# Patient Record
Sex: Male | Born: 1985 | Race: White | Hispanic: No | Smoking: Never smoker
Health system: Southern US, Community
[De-identification: ages and names within clinical notes are randomized; demographics above are authoritative.]

## PROBLEM LIST (undated history)

## (undated) DIAGNOSIS — K219 Gastro-esophageal reflux disease without esophagitis: Secondary | ICD-10-CM

## (undated) HISTORY — PX: HAND SURGERY: SHX662

## (undated) HISTORY — PX: VASECTOMY: SHX75

---

## 2005-02-28 ENCOUNTER — Ambulatory Visit: Payer: Self-pay | Admitting: Family Medicine

## 2005-11-07 ENCOUNTER — Ambulatory Visit: Payer: Self-pay | Admitting: Family Medicine

## 2006-01-11 ENCOUNTER — Ambulatory Visit: Payer: Self-pay | Admitting: Family Medicine

## 2006-05-10 ENCOUNTER — Ambulatory Visit: Payer: Self-pay | Admitting: Family Medicine

## 2017-08-28 ENCOUNTER — Ambulatory Visit (INDEPENDENT_AMBULATORY_CARE_PROVIDER_SITE_OTHER): Payer: BLUE CROSS/BLUE SHIELD | Admitting: Urology

## 2017-08-28 DIAGNOSIS — Z302 Encounter for sterilization: Secondary | ICD-10-CM

## 2017-10-09 ENCOUNTER — Encounter (INDEPENDENT_AMBULATORY_CARE_PROVIDER_SITE_OTHER): Payer: BLUE CROSS/BLUE SHIELD | Admitting: Urology

## 2017-10-09 DIAGNOSIS — Z302 Encounter for sterilization: Secondary | ICD-10-CM | POA: Diagnosis not present

## 2018-01-25 ENCOUNTER — Emergency Department (HOSPITAL_COMMUNITY): Payer: BLUE CROSS/BLUE SHIELD

## 2018-01-25 ENCOUNTER — Other Ambulatory Visit: Payer: Self-pay | Admitting: Orthopedic Surgery

## 2018-01-25 ENCOUNTER — Encounter (HOSPITAL_COMMUNITY): Payer: Self-pay

## 2018-01-25 ENCOUNTER — Encounter (HOSPITAL_COMMUNITY): Payer: Self-pay | Admitting: Emergency Medicine

## 2018-01-25 ENCOUNTER — Other Ambulatory Visit: Payer: Self-pay

## 2018-01-25 ENCOUNTER — Encounter (HOSPITAL_COMMUNITY)
Admission: RE | Admit: 2018-01-25 | Discharge: 2018-01-25 | Disposition: A | Discharge status: Home / Self Care | Payer: BLUE CROSS/BLUE SHIELD | Source: Ambulatory Visit | Attending: Orthopedic Surgery | Admitting: Orthopedic Surgery

## 2018-01-25 ENCOUNTER — Emergency Department (HOSPITAL_COMMUNITY)
Admission: EM | Admit: 2018-01-25 | Discharge: 2018-01-25 | Disposition: A | Discharge status: Home / Self Care | Payer: BLUE CROSS/BLUE SHIELD | Attending: Emergency Medicine | Admitting: Emergency Medicine

## 2018-01-25 DIAGNOSIS — Y999 Unspecified external cause status: Secondary | ICD-10-CM | POA: Diagnosis not present

## 2018-01-25 DIAGNOSIS — S4992XA Unspecified injury of left shoulder and upper arm, initial encounter: Secondary | ICD-10-CM | POA: Diagnosis present

## 2018-01-25 DIAGNOSIS — S52591A Other fractures of lower end of right radius, initial encounter for closed fracture: Secondary | ICD-10-CM | POA: Insufficient documentation

## 2018-01-25 DIAGNOSIS — Q899 Congenital malformation, unspecified: Secondary | ICD-10-CM

## 2018-01-25 DIAGNOSIS — Y929 Unspecified place or not applicable: Secondary | ICD-10-CM | POA: Diagnosis not present

## 2018-01-25 DIAGNOSIS — Y939 Activity, unspecified: Secondary | ICD-10-CM | POA: Insufficient documentation

## 2018-01-25 DIAGNOSIS — W228XXA Striking against or struck by other objects, initial encounter: Secondary | ICD-10-CM | POA: Insufficient documentation

## 2018-01-25 HISTORY — DX: Gastro-esophageal reflux disease without esophagitis: K21.9

## 2018-01-25 LAB — CBC WITH DIFFERENTIAL/PLATELET
Basophils Absolute: 0 10*3/uL (ref 0.0–0.1)
Basophils Relative: 0 %
EOS ABS: 0.1 10*3/uL (ref 0.0–0.7)
EOS PCT: 1 %
HCT: 47.1 % (ref 39.0–52.0)
Hemoglobin: 16.6 g/dL (ref 13.0–17.0)
LYMPHS ABS: 1.7 10*3/uL (ref 0.7–4.0)
LYMPHS PCT: 26 %
MCH: 32 pg (ref 26.0–34.0)
MCHC: 35.2 g/dL (ref 30.0–36.0)
MCV: 90.9 fL (ref 78.0–100.0)
Monocytes Absolute: 0.4 10*3/uL (ref 0.1–1.0)
Monocytes Relative: 6 %
NEUTROS PCT: 67 %
Neutro Abs: 4.5 10*3/uL (ref 1.7–7.7)
PLATELETS: 178 10*3/uL (ref 150–400)
RBC: 5.18 MIL/uL (ref 4.22–5.81)
RDW: 12.6 % (ref 11.5–15.5)
WBC: 6.7 10*3/uL (ref 4.0–10.5)

## 2018-01-25 MED ORDER — LIDOCAINE-EPINEPHRINE (PF) 1 %-1:200000 IJ SOLN
INTRAMUSCULAR | Status: AC
  Administered 2018-01-25: 12:00:00
  Filled 2018-01-25: qty 30

## 2018-01-25 MED ORDER — MORPHINE SULFATE (PF) 4 MG/ML IV SOLN
4.0000 mg | Freq: Once | INTRAVENOUS | Status: AC
  Administered 2018-01-25: 4 mg via INTRAVENOUS
  Filled 2018-01-25: qty 1

## 2018-01-25 MED ORDER — OXYCODONE-ACETAMINOPHEN 5-325 MG PO TABS: 1.0000 | ORAL_TABLET | ORAL | 0 refills | Status: DC | PRN

## 2018-01-25 MED ORDER — HYDROMORPHONE HCL 1 MG/ML IJ SOLN
INTRAMUSCULAR | Status: AC
  Administered 2018-01-25: 1 mg
  Filled 2018-01-25: qty 1

## 2018-01-25 NOTE — Progress Notes (Signed)
Patient ID: James Reed, male   DOB: 1985-09-27, 32 y.o.   MRN: [id number]  History reviewed. No pertinent past medical history.  Past Surgical History:  Procedure Laterality Date  . HAND SURGERY Left    4th and 5th metacarpal   No current facility-administered medications on file prior to encounter.    No current outpatient medications on file prior to encounter.    Scheduled Meds: Continuous Infusions: PRN Meds:.

## 2018-01-25 NOTE — ED Notes (Signed)
Pt transported to XRay 

## 2018-01-25 NOTE — Discharge Instructions (Signed)
Dr. Romeo AppleHarrison is arranging for you to have surgery tomorrow.  The office will contact you.  Please discuss this with the office and let them know what times are best for you, this may be a same-day procedure.
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
## 2018-01-28 ENCOUNTER — Telehealth: Payer: Self-pay | Admitting: Radiology

## 2018-01-28 ENCOUNTER — Encounter: Payer: Self-pay | Admitting: Orthopedic Surgery

## 2018-01-28 ENCOUNTER — Ambulatory Visit: Payer: BLUE CROSS/BLUE SHIELD | Admitting: Orthopedic Surgery

## 2018-01-28 DIAGNOSIS — S52371A Galeazzi's fracture of right radius, initial encounter for closed fracture: Secondary | ICD-10-CM

## 2018-01-28 NOTE — Patient Instructions (Addendum)
OUT OF WORK 12 WEEKS    Radial Fracture A radial fracture is a break in the radius bone, which is the long bone of the forearm that is on the same side as your thumb. Your forearm is the part of your arm that is between your elbow and your wrist. It is made up of two bones: the radius and the ulna. Most radial fractures occur near the wrist (distal radialfracture) or near the elbow (radial head fracture). A distal radial fracture is the most common type of broken arm. This fracture usually occurs about an inch above the wrist. Fractures of the middle part of the bone are less common. What are the causes? Falling with your arm outstretched is the most common cause of a radial fracture. Other causes include:  Car accidents.  Bike accidents.  A direct blow to the middle part of the radius.  What increases the risk?  You may be at greater risk for a distal radial fracture if you are 34 years of age or older.  You may be at greater risk for a radial head fracture if you are: ? Male. ? 4330-34 years old.  You may be at a greater risk for all types of radial fractures if you have a condition that causes your bones to be weak or thin (osteoporosis). What are the signs or symptoms? A radial fracture causes pain immediately after the injury. Other signs and symptoms include:  An abnormal bend or bump in your arm (deformity).  Swelling.  Bruising.  Numbness or tingling.  Tenderness.  Limited movement.  How is this diagnosed? Your health care provider may diagnose a radial fracture based on:  Your symptoms.  Your medical history, including any recent injury.  A physical exam. Your health care provider will look for any deformity and feel for tenderness over the break. Your health care provider will also check whether the bone is out of place.  An X-ray exam to confirm the diagnosis and learn more about the type of fracture.  How is this treated? The goals of treatment are to  get the bone in proper position for healing and to keep it from moving so it will heal over time. Your treatment will depend on many factors, especially the type of fracture that you have.  If the fractured bone: ? Is in the correct position (nondisplaced), you may only need to wear a cast or a splint. ? Has a slightly displaced fracture, you may need to have the bones moved back into place manually (closed reduction) before the splint or cast is put on.  You may have a temporary splint before you have a plaster cast. The splint allows room for some swelling. After a few days, a cast can replace the splint. ? You may have to wear the cast for about 6 weeks or as directed by your health care provider. ? The cast may be changed after about 3 weeks or as directed by your health care provider.  After your cast is taken off, you may need physical therapy to regain full movement in your wrist or elbow.  You may need emergency surgery if you have: ? A fractured bone that is out of position (displaced). ? A fracture with multiple fragments (comminuted fracture). ? A fracture that breaks the skin (open fracture). This type of fracture may require surgical wires, plates, or screws to hold the bone in place.  You may have X-rays every couple of weeks to check on  your healing.  Follow these instructions at home:  Keep the injured arm above the level of your heart while you are sitting or lying down. This helps to reduce swelling and pain.  Apply ice to the injured area: ? Put ice in a plastic bag. ? Place a towel between your skin and the bag. ? Leave the ice on for 20 minutes, 2-3 times per day.  Move your fingers often to avoid stiffness and to minimize swelling.  If you have a plaster or fiberglass cast: ? Do not try to scratch the skin under the cast using sharp or pointed objects. ? Check the skin around the cast every day. You may put lotion on any red or sore areas. ? Keep your cast dry  and clean.  If you have a plaster splint: ? Wear the splint as directed. ? Loosen the elastic around the splint if your fingers become numb and tingle, or if they turn cold and blue.  Do not put pressure on any part of your cast until it is fully hardened. Rest your cast only on a pillow for the first 24 hours.  Protect your cast or splint while bathing or showering, as directed by your health care provider. Do not put your cast or splint into water.  Take medicines only as directed by your health care provider.  Return to activities, such as sports, as directed by your health care provider. Ask your health care provider what activities are safe for you.  Keep all follow-up visits as directed by your health care provider. This is important. Contact a health care provider if:  Your pain medicine is not helping.  Your cast gets damaged or it breaks.  Your cast becomes loose.  Your cast gets wet.  You have more severe pain or swelling than you did before the cast.  You have severe pain when stretching your fingers.  You continue to have pain or stiffness in your elbow or your wrist after your cast is taken off. Get help right away if:  You cannot move your fingers.  You lose feeling in your fingers or your hand.  Your hand or your fingers turn cold and pale or blue.  You notice a bad smell coming from your cast.  You have drainage from underneath your cast.  You have new stains from blood or drainage seeping through your cast. This information is not intended to replace advice given to you by your health care provider. Make sure you discuss any questions you have with your health care provider. Document Released: 12/14/2005 Document Revised: 12/07/2015 Document Reviewed: 12/26/2013 Elsevier Interactive Patient Education  [employer].
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
## 2018-01-28 NOTE — Telephone Encounter (Signed)
I called [employer] today spoke to Ashtine J. 1:26 pm no prior authorization needed for the CPT code [id number] scheduled for tomorrow. Reference number for the call is her name todays date and time.

## 2018-01-29 ENCOUNTER — Ambulatory Visit (HOSPITAL_COMMUNITY): Payer: BLUE CROSS/BLUE SHIELD

## 2018-01-29 ENCOUNTER — Other Ambulatory Visit: Payer: Self-pay

## 2018-01-29 ENCOUNTER — Ambulatory Visit (HOSPITAL_COMMUNITY): Payer: BLUE CROSS/BLUE SHIELD | Admitting: Anesthesiology

## 2018-01-29 ENCOUNTER — Ambulatory Visit (HOSPITAL_COMMUNITY)
Admission: RE | Admit: 2018-01-29 | Discharge: 2018-01-29 | Disposition: A | Discharge status: Home / Self Care | Payer: BLUE CROSS/BLUE SHIELD | Source: Ambulatory Visit | Attending: Orthopedic Surgery | Admitting: Orthopedic Surgery

## 2018-01-29 ENCOUNTER — Encounter (HOSPITAL_COMMUNITY): Payer: Self-pay | Admitting: *Deleted

## 2018-01-29 ENCOUNTER — Encounter (HOSPITAL_COMMUNITY)
Admission: RE | Disposition: A | Discharge status: Home / Self Care | Payer: Self-pay | Source: Ambulatory Visit | Attending: Orthopedic Surgery

## 2018-01-29 DIAGNOSIS — K219 Gastro-esophageal reflux disease without esophagitis: Secondary | ICD-10-CM | POA: Diagnosis not present

## 2018-01-29 DIAGNOSIS — Y92481 Parking lot as the place of occurrence of the external cause: Secondary | ICD-10-CM | POA: Diagnosis not present

## 2018-01-29 DIAGNOSIS — Y9389 Activity, other specified: Secondary | ICD-10-CM | POA: Insufficient documentation

## 2018-01-29 DIAGNOSIS — Y998 Other external cause status: Secondary | ICD-10-CM | POA: Insufficient documentation

## 2018-01-29 DIAGNOSIS — S52611A Displaced fracture of right ulna styloid process, initial encounter for closed fracture: Secondary | ICD-10-CM | POA: Insufficient documentation

## 2018-01-29 DIAGNOSIS — S52371D Galeazzi's fracture of right radius, subsequent encounter for closed fracture with routine healing: Secondary | ICD-10-CM | POA: Diagnosis not present

## 2018-01-29 DIAGNOSIS — S62101A Fracture of unspecified carpal bone, right wrist, initial encounter for closed fracture: Secondary | ICD-10-CM

## 2018-01-29 DIAGNOSIS — S52371A Galeazzi's fracture of right radius, initial encounter for closed fracture: Secondary | ICD-10-CM

## 2018-01-29 DIAGNOSIS — W19XXXA Unspecified fall, initial encounter: Secondary | ICD-10-CM | POA: Diagnosis not present

## 2018-01-29 DIAGNOSIS — S52501A Unspecified fracture of the lower end of right radius, initial encounter for closed fracture: Secondary | ICD-10-CM | POA: Diagnosis present

## 2018-01-29 HISTORY — PX: ORIF WRIST FRACTURE: SHX2133

## 2018-01-29 LAB — MRSA PCR SCREENING: MRSA by PCR: NEGATIVE

## 2018-01-29 SURGERY — OPEN REDUCTION INTERNAL FIXATION (ORIF) WRIST FRACTURE
Anesthesia: General | Laterality: Right

## 2018-01-29 MED ORDER — GABAPENTIN 300 MG PO CAPS
300.0000 mg | ORAL_CAPSULE | Freq: Once | ORAL | Status: AC
  Administered 2018-01-29: 300 mg via ORAL

## 2018-01-29 MED ORDER — FENTANYL CITRATE (PF) 100 MCG/2ML IJ SOLN
INTRAMUSCULAR | Status: DC | PRN
  Administered 2018-01-29 (×3): 25 ug via INTRAVENOUS
  Administered 2018-01-29: 50 ug via INTRAVENOUS
  Administered 2018-01-29 (×2): 25 ug via INTRAVENOUS
  Administered 2018-01-29 (×2): 50 ug via INTRAVENOUS
  Administered 2018-01-29: 25 ug via INTRAVENOUS
  Administered 2018-01-29: 50 ug via INTRAVENOUS

## 2018-01-29 MED ORDER — CEFAZOLIN SODIUM-DEXTROSE 2-4 GM/100ML-% IV SOLN
2.0000 g | INTRAVENOUS | Status: AC
  Administered 2018-01-29: 2 g via INTRAVENOUS

## 2018-01-29 MED ORDER — FENTANYL CITRATE (PF) 250 MCG/5ML IJ SOLN
INTRAMUSCULAR | Status: AC
  Filled 2018-01-29: qty 5

## 2018-01-29 MED ORDER — KETOROLAC TROMETHAMINE 30 MG/ML IJ SOLN
30.0000 mg | Freq: Once | INTRAMUSCULAR | Status: AC
  Administered 2018-01-29: 30 mg via INTRAVENOUS

## 2018-01-29 MED ORDER — FENTANYL CITRATE (PF) 100 MCG/2ML IJ SOLN
INTRAMUSCULAR | Status: AC
  Filled 2018-01-29: qty 2

## 2018-01-29 MED ORDER — GABAPENTIN 300 MG PO CAPS
ORAL_CAPSULE | ORAL | Status: AC
  Filled 2018-01-29: qty 1

## 2018-01-29 MED ORDER — SODIUM CHLORIDE 0.9 % IR SOLN
Status: DC | PRN
  Administered 2018-01-29: 1000 mL

## 2018-01-29 MED ORDER — OXYCODONE HCL 5 MG PO TABS
ORAL_TABLET | ORAL | Status: AC
  Filled 2018-01-29: qty 1

## 2018-01-29 MED ORDER — KETOROLAC TROMETHAMINE 30 MG/ML IJ SOLN
INTRAMUSCULAR | Status: AC
  Filled 2018-01-29: qty 1

## 2018-01-29 MED ORDER — BUPIVACAINE HCL (PF) 0.5 % IJ SOLN
INTRAMUSCULAR | Status: DC | PRN
  Administered 2018-01-29: 30 mL

## 2018-01-29 MED ORDER — OXYCODONE-ACETAMINOPHEN 10-325 MG PO TABS: 1.0000 | ORAL_TABLET | ORAL | 0 refills | Status: DC | PRN

## 2018-01-29 MED ORDER — FENTANYL CITRATE (PF) 100 MCG/2ML IJ SOLN
25.0000 ug | INTRAMUSCULAR | Status: DC | PRN
  Administered 2018-01-29 (×4): 50 ug via INTRAVENOUS
  Filled 2018-01-29 (×2): qty 2

## 2018-01-29 MED ORDER — CEFAZOLIN SODIUM-DEXTROSE 2-4 GM/100ML-% IV SOLN
INTRAVENOUS | Status: AC
  Filled 2018-01-29: qty 100

## 2018-01-29 MED ORDER — POVIDONE-IODINE 10 % EX SWAB: 2.0000 "application " | Freq: Once | CUTANEOUS | Status: DC

## 2018-01-29 MED ORDER — OXYCODONE HCL 5 MG PO TABS
5.0000 mg | ORAL_TABLET | Freq: Once | ORAL | Status: AC
  Administered 2018-01-29: 5 mg via ORAL

## 2018-01-29 MED ORDER — KETAMINE HCL 10 MG/ML IJ SOLN
INTRAMUSCULAR | Status: DC | PRN
  Administered 2018-01-29 (×2): 10 mg via INTRAVENOUS

## 2018-01-29 MED ORDER — IBUPROFEN 800 MG PO TABS: 800.0000 mg | ORAL_TABLET | Freq: Three times a day (TID) | ORAL | 1 refills | Status: DC | PRN

## 2018-01-29 MED ORDER — MIDAZOLAM HCL 5 MG/5ML IJ SOLN
INTRAMUSCULAR | Status: DC | PRN
  Administered 2018-01-29: 2 mg via INTRAVENOUS

## 2018-01-29 MED ORDER — KETAMINE HCL 10 MG/ML IJ SOLN
INTRAMUSCULAR | Status: AC
  Filled 2018-01-29: qty 1

## 2018-01-29 MED ORDER — IBUPROFEN 800 MG PO TABS
ORAL_TABLET | ORAL | Status: AC
  Filled 2018-01-29: qty 1

## 2018-01-29 MED ORDER — LACTATED RINGERS IV SOLN
INTRAVENOUS | Status: DC
  Administered 2018-01-29: 13:00:00 via INTRAVENOUS

## 2018-01-29 MED ORDER — LIDOCAINE HCL (CARDIAC) PF 50 MG/5ML IV SOSY
PREFILLED_SYRINGE | INTRAVENOUS | Status: DC | PRN
  Administered 2018-01-29: 30 mg via INTRAVENOUS

## 2018-01-29 MED ORDER — HYDROCODONE-ACETAMINOPHEN 7.5-325 MG PO TABS
1.0000 | ORAL_TABLET | Freq: Once | ORAL | Status: DC | PRN
Start: 2018-01-29 — End: 2018-01-31

## 2018-01-29 MED ORDER — PROPOFOL 10 MG/ML IV BOLUS
INTRAVENOUS | Status: DC | PRN
  Administered 2018-01-29: 150 mg via INTRAVENOUS

## 2018-01-29 MED ORDER — ACETAMINOPHEN 500 MG PO TABS
500.0000 mg | ORAL_TABLET | Freq: Once | ORAL | Status: AC
  Administered 2018-01-29: 500 mg via ORAL

## 2018-01-29 MED ORDER — ACETAMINOPHEN 500 MG PO TABS
ORAL_TABLET | ORAL | Status: AC
  Filled 2018-01-29: qty 1

## 2018-01-29 MED ORDER — BUPIVACAINE HCL (PF) 0.5 % IJ SOLN
INTRAMUSCULAR | Status: AC
  Filled 2018-01-29: qty 30

## 2018-01-29 MED ORDER — MIDAZOLAM HCL 2 MG/2ML IJ SOLN
INTRAMUSCULAR | Status: AC
  Filled 2018-01-29: qty 2

## 2018-01-29 MED ORDER — IBUPROFEN 800 MG PO TABS
800.0000 mg | ORAL_TABLET | Freq: Once | ORAL | Status: AC
  Administered 2018-01-29: 800 mg via ORAL

## 2018-01-29 MED ORDER — PROMETHAZINE HCL 12.5 MG PO TABS: 12.5000 mg | ORAL_TABLET | Freq: Four times a day (QID) | ORAL | 0 refills | Status: DC | PRN

## 2018-01-29 MED ORDER — CHLORHEXIDINE GLUCONATE 4 % EX LIQD: 60.0000 mL | Freq: Once | CUTANEOUS | Status: DC

## 2018-01-29 SURGICAL SUPPLY — 41 items
BANDAGE ELASTIC 3 LF NS (GAUZE/BANDAGES/DRESSINGS) ×3 IMPLANT
BANDAGE ELASTIC 4 LF NS (GAUZE/BANDAGES/DRESSINGS) ×3 IMPLANT
BANDAGE ESMARK 4X12 BL STRL LF (DISPOSABLE) ×1 IMPLANT
BIT DRILL 2.5X110 QC LCP DISP (BIT) ×3 IMPLANT
BNDG COHESIVE 4X5 TAN STRL (GAUZE/BANDAGES/DRESSINGS) ×3 IMPLANT
BNDG ESMARK 4X12 BLUE STRL LF (DISPOSABLE) ×3
CHLORAPREP W/TINT 26ML (MISCELLANEOUS) ×3 IMPLANT
CLOTH BEACON ORANGE TIMEOUT ST (SAFETY) ×3 IMPLANT
COVER LIGHT HANDLE STERIS (MISCELLANEOUS) ×6 IMPLANT
CUFF TOURNIQUET SINGLE 18IN (TOURNIQUET CUFF) ×3 IMPLANT
DRAPE C-ARM FOLDED MOBILE STRL (DRAPES) ×3 IMPLANT
ELECT REM PT RETURN 9FT ADLT (ELECTROSURGICAL) ×3
ELECTRODE REM PT RTRN 9FT ADLT (ELECTROSURGICAL) ×1 IMPLANT
GAUZE SPONGE 4X4 12PLY STRL (GAUZE/BANDAGES/DRESSINGS) ×3 IMPLANT
GAUZE XEROFORM 5X9 LF (GAUZE/BANDAGES/DRESSINGS) ×3 IMPLANT
GLOVE BIO SURGEON STRL SZ7 (GLOVE) ×3 IMPLANT
GLOVE SKINSENSE NS SZ8.0 LF (GLOVE) ×2
GLOVE SKINSENSE STRL SZ8.0 LF (GLOVE) ×1 IMPLANT
GLOVE SS N UNI LF 8.5 STRL (GLOVE) ×3 IMPLANT
GOWN STRL REUS W/ TWL LRG LVL3 (GOWN DISPOSABLE) ×1 IMPLANT
GOWN STRL REUS W/TWL LRG LVL3 (GOWN DISPOSABLE) ×5 IMPLANT
GOWN STRL REUS W/TWL XL LVL3 (GOWN DISPOSABLE) ×3 IMPLANT
KIT TURNOVER KIT A (KITS) ×3 IMPLANT
MANIFOLD NEPTUNE II (INSTRUMENTS) ×3 IMPLANT
NEEDLE HYPO 21X1.5 SAFETY (NEEDLE) ×3 IMPLANT
NS IRRIG 1000ML POUR BTL (IV SOLUTION) ×6 IMPLANT
PACK BASIC LIMB (CUSTOM PROCEDURE TRAY) ×3 IMPLANT
PAD ARMBOARD 7.5X6 YLW CONV (MISCELLANEOUS) ×3 IMPLANT
PROS LCP PLATE 6H 85MM (Plate) ×3 IMPLANT
PROSTHESIS LCP PLATE 6H 85MM (Plate) ×1 IMPLANT
SCREW CORTEX 3.5 16MM (Screw) ×8 IMPLANT
SCREW CORTEX 3.5 18MM (Screw) ×4 IMPLANT
SCREW LOCK CORT ST 3.5X16 (Screw) ×4 IMPLANT
SCREW LOCK CORT ST 3.5X18 (Screw) ×2 IMPLANT
SET BASIN LINEN APH (SET/KITS/TRAYS/PACK) ×3 IMPLANT
SPLINT IMMOBILIZER J 3INX20FT (CAST SUPPLIES) ×2
SPLINT J IMMOBILIZER 3X20FT (CAST SUPPLIES) ×1 IMPLANT
STAPLER VISISTAT 35W (STAPLE) ×3 IMPLANT
SUT MON AB 2-0 SH 27 (SUTURE) ×2
SUT MON AB 2-0 SH27 (SUTURE) ×1 IMPLANT
SYR 10ML LL (SYRINGE) ×3 IMPLANT

## 2018-01-29 NOTE — Brief Op Note (Signed)
01/29/2018  2:35 PM  PATIENT:  James NaasKenneth D Reed  32 y.o. male  PRE-OPERATIVE DIAGNOSIS:  fracture right wrist/distal radius/forearm, GALEAZZI fracture  POST-OPERATIVE DIAGNOSIS:  fracture right wrist/distal radius/forearm, GALEAZZI fracture  PROCEDURE:  Procedure(s): OPEN REDUCTION INTERNAL FIXATION (ORIF) RIGHT WRIST/distal radius/forearm, GALEAZZI  FRACTURE (Right)-25525  SURGEON:  Surgeon(s) and Role:    * Vickki HearingHarrison, Afton Lavalle E, MD - Primary  PHYSICIAN ASSISTANT:   ASSISTANTS: BETTY ASHLEY    ANESTHESIA:   general  EBL:  10 mL   BLOOD ADMINISTERED:none  DRAINS: none   LOCAL MEDICATIONS USED:  MARCAINE     SPECIMEN:  No Specimen  DISPOSITION OF SPECIMEN:  N/A  COUNTS:  YES  TOURNIQUET:   Total Tourniquet Time Documented: Upper Arm (Right) - 44 minutes Total: Upper Arm (Right) - 44 minutes   DICTATION: .Reubin Milanragon Dictation  PLAN OF CARE: Discharge to home after PACU  PATIENT DISPOSITION:  PACU - hemodynamically stable.   Delay start of Pharmacological VTE agent (>24hrs) due to surgical blood loss or risk of bleeding: not applicable

## 2018-01-29 NOTE — Interval H&P Note (Signed)
History and Physical Interval Note:  01/29/2018 1:05 PM  James Reed  has presented today for surgery, with the diagnosis of galeazzi fracture dislcoation right wrist  The various methods of treatment have been discussed with the patient and family. After consideration of risks, benefits and other options for treatment, the patient has consented to  Procedure(s): OPEN REDUCTION INTERNAL FIXATION (ORIF) RIGHT WRIST FRACTURE (Right) as a surgical intervention .  The patient's history has been reviewed, patient examined, no change in status, stable for surgery.  I have reviewed the patient's chart and labs.  Questions were answered to the patient's satisfaction.     [name]

## 2018-01-29 NOTE — Transfer of Care (Signed)
Immediate Anesthesia Transfer of Care Note  Patient: James NaasKenneth D Kinnan  Procedure(s) Performed: OPEN REDUCTION INTERNAL FIXATION (ORIF) RIGHT WRIST FRACTURE DISTAL RADIUS (Right )  Patient Location: PACU  Anesthesia Type:General  Level of Consciousness: awake and alert   Airway & Oxygen Therapy: Patient Spontanous Breathing and Patient connected to face mask oxygen  Post-op Assessment: Report given to RN  Post vital signs: Reviewed and stable  Last Vitals:  Vitals Value Taken Time  BP 144/87 01/29/2018  2:43 PM  Temp    Pulse 89 01/29/2018  2:45 PM  Resp 8 01/29/2018  2:45 PM  SpO2 98 % 01/29/2018  2:45 PM  Vitals shown include unvalidated device data.  Last Pain:  Vitals:   01/29/18 1115  TempSrc: Oral  PainSc: 4       Patients Stated Pain Goal: 8 (01/29/18 1115)  Complications: No apparent anesthesia complications

## 2018-01-29 NOTE — Op Note (Signed)
01/29/2018  2:35 PM  PATIENT:  James NaasKenneth D Reed  32 y.o. male  PRE-OPERATIVE DIAGNOSIS:  fracture right wrist/distal radius/forearm, GALEAZZI fracture  POST-OPERATIVE DIAGNOSIS:  fracture right wrist/distal radius/forearm, GALEAZZI fracture  PROCEDURE:  Procedure(s): OPEN REDUCTION INTERNAL FIXATION (ORIF) RIGHT WRIST/distal radius/forearm, GALEAZZI  FRACTURE (Right)-25525  Details of surgery  The patient was seen in preop and marked for surgery after confirmation chart review.  He was taken to the operating for general anesthesia.  Appropriate antibiotics were started in the right arm was cleaned with chlorhexidine and then prepped and draped in the usual sterile manner  Timeout was completed  We performed an anterior approach (anterior approach of Sherilyn CooterHenry) and made a volar incision divided the subcutaneous tissue found the FCR tendon sheath and opened.  We retracted the FCR and then the fracture ends were buttonholed through the flexor to the thumb and we did subperiosteal dissection until we found both fracture ends we irrigated them and then perform manual reduction.  The plate was contoured to meet the radial pulse, this was followed with:  we placed a clamp on the fracture and then took an x-ray.  AP and lateral x-ray confirmed reduction and the DRUJ seemed to be subluxating.  After application of the plate starting with the hold most close to the fracture in the non-dynamic mode and then compression screw was placed opposite the fracture.  Radiograph confirmed reduction and DRUJ was stabilized and not subluxating.  This was checked manually and with rotation under fluoroscopic guidance  We continued with further plate application using AO technique.  We used all cortical non-locking screws  Forearm rotation was also checked at the end of the procedure was found to be normal.  He had excellent flexion-extension.  We irrigated the wound and then closed with 2-0 Monocryl in interrupted  and running fashion  Staples were used to close the skin, sterile dressing was applied  After injecting Marcaine with epinephrine 30 cc we used a sugar tong splint in neutral position was applied.  After extubation patient was taken recovery room stable condition    SURGEON:  Surgeon(s) and Role:    * Vickki HearingHarrison, Stanley E, MD - Primary  PHYSICIAN ASSISTANT:   ASSISTANTS: BETTY ASHLEY    ANESTHESIA:   general  EBL:  10 mL   BLOOD ADMINISTERED:none  DRAINS: none   LOCAL MEDICATIONS USED:  MARCAINE     SPECIMEN:  No Specimen  DISPOSITION OF SPECIMEN:  N/A  COUNTS:  YES  TOURNIQUET:   Total Tourniquet Time Documented: Upper Arm (Right) - 44 minutes Total: Upper Arm (Right) - 44 minutes   DICTATION: .Reubin Milanragon Dictation  PLAN OF CARE: Discharge to home after PACU  PATIENT DISPOSITION:  PACU - hemodynamically stable.   Delay start of Pharmacological VTE agent (>24hrs) due to surgical blood loss or risk of bleeding: not applicable

## 2018-01-29 NOTE — Discharge Instructions (Signed)
Wrist Fracture Treated With ORIF, Care After Refer to this sheet in the next few weeks. These instructions provide you with information about caring for yourself after your procedure. Your health care provider may also give you more specific instructions. Your treatment has been planned according to current medical practices, but problems sometimes occur. Call your health care provider if you have any problems or questions after your procedure. What can I expect after the procedure? After the procedure, it is common to have:  Pain.  Swelling.  Stiffness.  Follow these instructions at home: If you have a splint:  Wear the splint as told by your health care provider. Remove it only as told by your health care provider.  Loosen the splint if your fingers tingle, become numb, or turn cold and blue.  Do not let your splint get wet if it is not waterproof.  Keep the splint clean. If you have a sling:  Wear it as told by your health care provider. Remove it only as told by your health care provider. If you have a cast:  Do not stick anything inside the cast to scratch your skin. Doing that increases your risk of infection.  Check the skin around the cast every day. Report any concerns to your health care provider. You may put lotion on dry skin around the edges of the cast. Do not apply lotion to the skin underneath the cast.  Do not let your cast get wet if it is not waterproof.  Keep the cast clean. Bathing  Do not take baths, swim, or use a hot tub until your health care provider approves. Ask your health care provider if you can take showers. You may only be allowed to take sponge baths for bathing.  If your cast or splint is not waterproof, cover it with a watertight plastic bag when you take a bath or a shower.  Keep the bandage (dressing) dry until your health care provider says it can be removed.  If you have a sling, remove it for bathing only if your health care provider  tells you that it is safe to do that. Incision care  Follow instructions from your health care provider about how to take care of your incision. Make sure you: ? Wash your hands with soap and water before you change your bandage (dressing). If soap and water are not available, use hand sanitizer. ? Change your dressing as told by your health care provider. ? Leave stitches (sutures), skin glue, or adhesive strips in place. These skin closures may need to stay in place for 2 weeks or longer. If adhesive strip edges start to loosen and curl up, you may trim the loose edges. Do not remove adhesive strips completely unless your health care provider tells you to do that.  Check your incision area every day for signs of infection. Check for: ? More redness, swelling, or pain. ? More fluid or blood. ? Warmth. ? Pus or a bad smell. Managing pain, stiffness, and swelling  If directed, apply ice to the injured area. ? Put ice in a plastic bag. ? Place a towel between your skin and the bag. ? Leave the ice on for 20 minutes, 2-3 times a day.  Move your fingers often to avoid stiffness and to lessen swelling.  Raise (elevate) the injured area above the level of your heart while you are sitting or lying down. General Anesthesia, Adult, Care After These instructions provide you with information about caring for  yourself after your procedure. Your health care provider may also give you more specific instructions. Your treatment has been planned according to current medical practices, but problems sometimes occur. Call your health care provider if you have any problems or questions after your procedure. What can I expect after the procedure? After the procedure, it is common to have:  Vomiting.  A sore throat.  Mental slowness.  It is common to feel:  Nauseous.  Cold or shivery.  Sleepy.  Tired.  Sore or achy, even in parts of your body where you did not have surgery.  Follow these  instructions at home: For at least 24 hours after the procedure:  Do not: ? Participate in activities where you could fall or become injured. ? Drive. ? Use heavy machinery. ? Drink alcohol. ? Take sleeping pills or medicines that cause drowsiness. ? Make important decisions or sign legal documents. ? Take care of children on your own.  Rest. Eating and drinking  If you vomit, drink water, juice, or soup when you can drink without vomiting.  Drink enough fluid to keep your urine clear or pale yellow.  Make sure you have little or no nausea before eating solid foods.  Follow the diet recommended by your health care provider. General instructions  Have a responsible adult stay with you until you are awake and alert.  Return to your normal activities as told by your health care provider. Ask your health care provider what activities are safe for you.  Take over-the-counter and prescription medicines only as told by your health care provider.  If you smoke, do not smoke without supervision.  Keep all follow-up visits as told by your health care provider. This is important. Contact a health care provider if:  You continue to have nausea or vomiting at home, and medicines are not helpful.  You cannot drink fluids or start eating again.  You cannot urinate after 8-12 hours.  You develop a skin rash.  You have fever.  You have increasing redness at the site of your procedure. Get help right away if:  You have difficulty breathing.  You have chest pain.  You have unexpected bleeding.  You feel that you are having a life-threatening or urgent problem. This information is not intended to replace advice given to you by your health care provider. Make sure you discuss any questions you have with your health care provider. Document Released: 10/09/2000 Document Revised: 12/06/2015 Document Reviewed: 06/17/2015 Elsevier Interactive Patient Education  2018 [employer] Driving  Do not drive or operate heavy machinery while taking prescription pain medicine.  Do not drive for 24 hours if you received a sedative.  Ask your health care provider when it is safe to drive if you have a cast, splint, or sling on your wrist. Activity  Return to your normal activities as told by your health care provider. Ask your health care provider what activities are safe for you.  Do exercises as told by your health care provider. General instructions  Do not put pressure on any part of the cast or splint until it is fully hardened. This may take several hours.  Do not use any tobacco products, such as cigarettes, chewing tobacco, and e-cigarettes. Tobacco can delay bone healing. If you need help quitting, ask your health care provider.  Take over-the-counter and prescription medicines only as told by your health care provider.  Keep all follow-up visits as told by your health care provider. This is important.  Contact a health care provider if:  Your cast, splint, or sling is damaged or loose.  Your pain is not controlled with medicine.  You develop a rash.  You have more redness, swelling, or pain around your incision.  Your incision feels warm to the touch.  You have more fluid or blood coming from incision.  You have a fever. Get help right away if:  Your skin or fingers on your injured arm turn blue or gray.  Your arm feels cold or numb.  You have severe pain in your injured wrist.  You have pus or a bad smell coming from your incision.  You have trouble breathing.  You feel faint or light-headed. This information is not intended to replace advice given to you by your health care provider. Make sure you discuss any questions you have with your health care provider. Document Released: 06/14/2015 Document Revised: 12/09/2015 Document Reviewed: 03/17/2015 Elsevier Interactive Patient Education  [employer].

## 2018-01-29 NOTE — Anesthesia Postprocedure Evaluation (Signed)
Anesthesia Post Note  Patient: Reginia NaasKenneth D Henneke  Procedure(s) Performed: OPEN REDUCTION INTERNAL FIXATION (ORIF) RIGHT WRIST FRACTURE DISTAL RADIUS (Right )  Patient location during evaluation: PACU Anesthesia Type: General Level of consciousness: awake and alert and oriented Pain management: pain level controlled Vital Signs Assessment: post-procedure vital signs reviewed and stable Respiratory status: spontaneous breathing Cardiovascular status: blood pressure returned to baseline and stable Postop Assessment: no apparent nausea or vomiting Anesthetic complications: no     Last Vitals:  Vitals:   01/29/18 1445 01/29/18 1500  BP: (!) 149/102 (!) 152/102  Pulse: 89 92  Resp: (!) 8 15  Temp: 36.9 C   SpO2: 98% 100%    Last Pain:  Vitals:   01/29/18 1450  TempSrc:   PainSc: Asleep                 Evalyne Cortopassi

## 2018-01-29 NOTE — Anesthesia Procedure Notes (Signed)
Procedure Name: LMA Insertion Date/Time: 01/29/2018 1:22 PM Performed by: Moshe Salisburyaniel, Harsimran Westman E, CRNA Pre-anesthesia Checklist: Patient identified, Patient being monitored, Emergency Drugs available, Timeout performed and Suction available Patient Re-evaluated:Patient Re-evaluated prior to induction Oxygen Delivery Method: Circle System Utilized Preoxygenation: Pre-oxygenation with 100% oxygen Induction Type: IV induction Ventilation: Mask ventilation without difficulty LMA: LMA inserted LMA Size: 4.0 Number of attempts: 1 Placement Confirmation: positive ETCO2 and breath sounds checked- equal and bilateral

## 2018-01-29 NOTE — Progress Notes (Addendum)
  January 29, 2018  Patient: [name]  Date of Birth: November 10, 1985  Date of Visit: 01/25/2018    To Whom It May Concern:  James LampKenneth Reed was seen and treated in our surgical department on 01/25/2018. It is required that he have someone to monitor him for 24 hours after surgery..  Sincerely,   Jeani HawkingAnnie Penn Nursing Staff

## 2018-01-29 NOTE — Anesthesia Preprocedure Evaluation (Signed)
Anesthesia Evaluation  Patient identified by MRN, date of birth, ID band Patient awake    Reviewed: Allergy & Precautions, NPO status , Patient's Chart, lab work & pertinent test results  Airway Mallampati: II  TM Distance: >3 FB Neck ROM: Full    Dental no notable dental hx. (+) Teeth Intact   Pulmonary neg pulmonary ROS,    Pulmonary exam normal breath sounds clear to auscultation       Cardiovascular Exercise Tolerance: Good negative cardio ROS Normal cardiovascular examI Rhythm:Regular Rate:Normal     Neuro/Psych negative neurological ROS  negative psych ROS   GI/Hepatic negative GI ROS, Neg liver ROS, Pt denied GERD or GERD meds    Endo/Other  negative endocrine ROS  Renal/GU negative Renal ROS  negative genitourinary   Musculoskeletal negative musculoskeletal ROS (+)   Abdominal   Peds negative pediatric ROS (+)  Hematology negative hematology ROS (+)   Anesthesia Other Findings   Reproductive/Obstetrics negative OB ROS                             Anesthesia Physical Anesthesia Plan  ASA: II  Anesthesia Plan: General   Post-op Pain Management:    Induction: Intravenous  PONV Risk Score and Plan:   Airway Management Planned: LMA  Additional Equipment:   Intra-op Plan:   Post-operative Plan: Extubation in OR  Informed Consent: I have reviewed the patients History and Physical, chart, labs and discussed the procedure including the risks, benefits and alternatives for the proposed anesthesia with the patient or authorized representative who has indicated his/her understanding and acceptance.   Dental advisory given  Plan Discussed with: CRNA  Anesthesia Plan Comments:         Anesthesia Quick Evaluation

## 2018-01-30 ENCOUNTER — Encounter (HOSPITAL_COMMUNITY): Payer: Self-pay | Admitting: Orthopedic Surgery

## 2018-02-01 ENCOUNTER — Ambulatory Visit (INDEPENDENT_AMBULATORY_CARE_PROVIDER_SITE_OTHER): Payer: BLUE CROSS/BLUE SHIELD | Admitting: Orthopedic Surgery

## 2018-02-01 ENCOUNTER — Encounter: Payer: Self-pay | Admitting: Orthopedic Surgery

## 2018-02-01 ENCOUNTER — Ambulatory Visit: Payer: BLUE CROSS/BLUE SHIELD | Admitting: Orthopedic Surgery

## 2018-02-01 DIAGNOSIS — Z9889 Other specified postprocedural states: Secondary | ICD-10-CM

## 2018-02-01 DIAGNOSIS — Z967 Presence of other bone and tendon implants: Secondary | ICD-10-CM

## 2018-02-01 DIAGNOSIS — Z8781 Personal history of (healed) traumatic fracture: Secondary | ICD-10-CM

## 2018-02-01 MED ORDER — OXYCODONE-ACETAMINOPHEN 10-325 MG PO TABS: 1.0000 | ORAL_TABLET | ORAL | 0 refills | Status: DC | PRN

## 2018-02-01 MED ORDER — PROMETHAZINE HCL 12.5 MG PO TABS: 12.5000 mg | ORAL_TABLET | Freq: Four times a day (QID) | ORAL | 0 refills | Status: DC | PRN

## 2018-02-01 NOTE — Progress Notes (Signed)
Chief Complaint  Patient presents with  . Post-op Follow-up    01/29/18    Pod # 3   Current Outpatient Medications:  .  ibuprofen (ADVIL,MOTRIN) 800 MG tablet, Take 1 tablet (800 mg total) by mouth every 8 (eight) hours as needed., Disp: 90 tablet, Rfl: 1 .  oxyCODONE-acetaminophen (PERCOCET) 10-325 MG tablet, Take 1 tablet by mouth every 4 (four) hours as needed for pain., Disp: 42 tablet, Rfl: 0 .  promethazine (PHENERGAN) 12.5 MG tablet, Take 1 tablet (12.5 mg total) by mouth every 6 (six) hours as needed for nausea or vomiting., Disp: 30 tablet, Rfl: 0  Splint changed   Wound good   He is swollen, encourage more ice and elevation   Encounter Diagnosis  Name Primary?  . S/P ORIF (open reduction internal fixation) fracture right wrist 01/29/18    Return tues 30th for xrays and velcro removable splint. Then follow 4 weeks for xrays    Dr Hilda LiasKeeling to see

## 2018-02-12 ENCOUNTER — Ambulatory Visit (INDEPENDENT_AMBULATORY_CARE_PROVIDER_SITE_OTHER): Payer: BLUE CROSS/BLUE SHIELD

## 2018-02-12 ENCOUNTER — Encounter: Payer: Self-pay | Admitting: Orthopedic Surgery

## 2018-02-12 ENCOUNTER — Encounter: Payer: Self-pay | Admitting: Orthopaedic Surgery

## 2018-02-12 ENCOUNTER — Ambulatory Visit (INDEPENDENT_AMBULATORY_CARE_PROVIDER_SITE_OTHER): Payer: BLUE CROSS/BLUE SHIELD | Admitting: Orthopaedic Surgery

## 2018-02-12 VITALS — BP 125/78 | HR 72 | Ht 67.0 in | Wt 211.0 lb

## 2018-02-12 DIAGNOSIS — S52371A Galeazzi's fracture of right radius, initial encounter for closed fracture: Secondary | ICD-10-CM

## 2018-02-12 DIAGNOSIS — Z9889 Other specified postprocedural states: Secondary | ICD-10-CM

## 2018-02-12 DIAGNOSIS — Z967 Presence of other bone and tendon implants: Secondary | ICD-10-CM

## 2018-02-12 DIAGNOSIS — Z8781 Personal history of (healed) traumatic fracture: Secondary | ICD-10-CM

## 2018-02-12 MED ORDER — OXYCODONE-ACETAMINOPHEN 10-325 MG PO TABS: 1.0000 | ORAL_TABLET | ORAL | 0 refills | Status: DC | PRN

## 2018-02-12 NOTE — Progress Notes (Signed)
CC:  My staples need to come out  He had a closed Galeazzi's fracture of th right distal radius that Dr. Romeo AppleHarrison did an open reduction and internal fixation on 01-29-18.  He has been in a sugar tong splint since then.  He has had no problems, no fever.  The wound looks good.  NV intact.  Staples removed.  X-rays were done, reported separately.  Encounter Diagnoses  Name Primary?  . S/P ORIF (open reduction internal fixation) fracture right wrist 01/29/18 Yes  . Closed Galeazzi's fracture of right radius, initial encounter    New splint applied.  Return in two weeks to see Dr. Romeo AppleHarrison. Pain medicine renewed.  I have [place] the [place] Cont[place] Sub[place] Reporting System web site prior to prescribing narcotic medicine for this patient.  X-rays at that time.  Call if any problem.  Precautions discussed.  Electronically Signed Darreld McleanWayne Kavaughn Faucett, MD 7/30/20198:49 AM

## 2018-02-25 ENCOUNTER — Ambulatory Visit (INDEPENDENT_AMBULATORY_CARE_PROVIDER_SITE_OTHER): Payer: BLUE CROSS/BLUE SHIELD | Admitting: Orthopedic Surgery

## 2018-02-25 ENCOUNTER — Encounter: Payer: Self-pay | Admitting: Orthopedic Surgery

## 2018-02-25 ENCOUNTER — Ambulatory Visit (INDEPENDENT_AMBULATORY_CARE_PROVIDER_SITE_OTHER): Payer: BLUE CROSS/BLUE SHIELD

## 2018-02-25 VITALS — BP 132/86 | HR 70 | Ht 67.0 in | Wt 211.0 lb

## 2018-02-25 DIAGNOSIS — Z967 Presence of other bone and tendon implants: Secondary | ICD-10-CM | POA: Diagnosis not present

## 2018-02-25 DIAGNOSIS — Z8781 Personal history of (healed) traumatic fracture: Secondary | ICD-10-CM | POA: Diagnosis not present

## 2018-02-25 DIAGNOSIS — S52371D Galeazzi's fracture of right radius, subsequent encounter for closed fracture with routine healing: Secondary | ICD-10-CM

## 2018-02-25 DIAGNOSIS — Z9889 Other specified postprocedural states: Secondary | ICD-10-CM

## 2018-02-25 NOTE — Progress Notes (Signed)
Chief Complaint  Patient presents with  . Follow-up    Recheck on right wrist fracture, DOI 01-29-18.    Week for status post Galeazzi fracture fixation right wrist  Complains of stiffness  Past range of motion is near normal  Active range of motion shows decreased extension and flexion  Wound is clean dry and intact he is tender over the distal radial ulnar joint  X-ray shows anatomic reduction of the radius as well as the DRUJ  Recommend forearm splint active and passive range of motion follow-up in 4 weeks repeat x-ray

## 2018-03-25 ENCOUNTER — Encounter: Payer: Self-pay | Admitting: Orthopedic Surgery

## 2018-03-25 ENCOUNTER — Ambulatory Visit (INDEPENDENT_AMBULATORY_CARE_PROVIDER_SITE_OTHER): Payer: BLUE CROSS/BLUE SHIELD | Admitting: Orthopedic Surgery

## 2018-03-25 ENCOUNTER — Other Ambulatory Visit: Payer: Self-pay | Admitting: Orthopedic Surgery

## 2018-03-25 ENCOUNTER — Ambulatory Visit (INDEPENDENT_AMBULATORY_CARE_PROVIDER_SITE_OTHER): Payer: BLUE CROSS/BLUE SHIELD

## 2018-03-25 VITALS — BP 120/78 | HR 75 | Ht 67.0 in | Wt 209.0 lb

## 2018-03-25 DIAGNOSIS — Z8781 Personal history of (healed) traumatic fracture: Secondary | ICD-10-CM

## 2018-03-25 DIAGNOSIS — Z9889 Other specified postprocedural states: Secondary | ICD-10-CM

## 2018-03-25 DIAGNOSIS — Z967 Presence of other bone and tendon implants: Secondary | ICD-10-CM | POA: Diagnosis not present

## 2018-03-25 NOTE — Progress Notes (Signed)
Chief Complaint  Patient presents with  . Arm Injury    Right forearm DOS 01/29/18   8 WEEKS POST OP   WOUND: Clean dry intact no erythema  ROM: Full range of motion of the fingers of his hand the swelling is gone down  His pronation supination are still limited as this is flexion extension  His x-ray shows the fracture is healing well  Follow-up in 4 weeks repeat x-ray and then return to work

## 2018-04-22 ENCOUNTER — Ambulatory Visit: Payer: BLUE CROSS/BLUE SHIELD

## 2018-04-22 ENCOUNTER — Ambulatory Visit (HOSPITAL_COMMUNITY)
Admission: RE | Admit: 2018-04-22 | Discharge: 2018-04-22 | Disposition: A | Discharge status: Home / Self Care | Payer: BLUE CROSS/BLUE SHIELD | Source: Ambulatory Visit | Attending: Orthopedic Surgery | Admitting: Orthopedic Surgery

## 2018-04-22 ENCOUNTER — Encounter: Payer: Self-pay | Admitting: Orthopedic Surgery

## 2018-04-22 ENCOUNTER — Ambulatory Visit (INDEPENDENT_AMBULATORY_CARE_PROVIDER_SITE_OTHER): Payer: BLUE CROSS/BLUE SHIELD | Admitting: Orthopedic Surgery

## 2018-04-22 VITALS — BP 124/80 | HR 62 | Ht 69.0 in | Wt 209.0 lb

## 2018-04-22 DIAGNOSIS — Z8781 Personal history of (healed) traumatic fracture: Secondary | ICD-10-CM

## 2018-04-22 DIAGNOSIS — S52371D Galeazzi's fracture of right radius, subsequent encounter for closed fracture with routine healing: Secondary | ICD-10-CM

## 2018-04-22 DIAGNOSIS — Z9889 Other specified postprocedural states: Secondary | ICD-10-CM

## 2018-04-22 DIAGNOSIS — X58XXXD Exposure to other specified factors, subsequent encounter: Secondary | ICD-10-CM | POA: Diagnosis not present

## 2018-04-22 DIAGNOSIS — S52611D Displaced fracture of right ulna styloid process, subsequent encounter for closed fracture with routine healing: Secondary | ICD-10-CM | POA: Insufficient documentation

## 2018-04-22 NOTE — Patient Instructions (Signed)
Return to work on October 10

## 2018-04-22 NOTE — Progress Notes (Signed)
Chief Complaint  Patient presents with  . Wrist Injury    01/29/18 ORIF right wrist     Encounter Diagnoses  Name Primary?  . S/P ORIF (open reduction internal fixation) fracture right wrist 01/29/18 Yes  . Closed Galeazzi's fracture of right radius with routine healing, subsequent encounter     POD # 86   Today's x-ray was done at the hospital AP lateral of the forearm the radial ulnar joint is reduced the radius fracture is reduced and healed with bridging callus on all x-rays  The exam shows a well-healed incision he has regained full range of motion.  He can return to work on Thursday, October 10  Follow-up as needed

## 2018-09-21 ENCOUNTER — Other Ambulatory Visit (HOSPITAL_COMMUNITY): Payer: Self-pay | Admitting: Orthopedic Surgery

## 2018-11-20 ENCOUNTER — Other Ambulatory Visit: Payer: Self-pay | Admitting: Orthopedic Surgery

## 2019-03-01 ENCOUNTER — Emergency Department (HOSPITAL_COMMUNITY): Payer: BC Managed Care – PPO

## 2019-03-01 ENCOUNTER — Inpatient Hospital Stay (HOSPITAL_COMMUNITY)
Admission: EM | Admit: 2019-03-01 | Discharge: 2019-03-04 | DRG: 987 | Disposition: A | Discharge status: Home / Self Care | Payer: BC Managed Care – PPO | Attending: Surgery | Admitting: Surgery

## 2019-03-01 ENCOUNTER — Inpatient Hospital Stay (HOSPITAL_COMMUNITY): Payer: BC Managed Care – PPO

## 2019-03-01 DIAGNOSIS — Y908 Blood alcohol level of 240 mg/100 ml or more: Secondary | ICD-10-CM | POA: Diagnosis present

## 2019-03-01 DIAGNOSIS — S81812A Laceration without foreign body, left lower leg, initial encounter: Secondary | ICD-10-CM | POA: Diagnosis present

## 2019-03-01 DIAGNOSIS — Z23 Encounter for immunization: Secondary | ICD-10-CM

## 2019-03-01 DIAGNOSIS — S20311A Abrasion of right front wall of thorax, initial encounter: Secondary | ICD-10-CM | POA: Diagnosis present

## 2019-03-01 DIAGNOSIS — S30811A Abrasion of abdominal wall, initial encounter: Secondary | ICD-10-CM | POA: Diagnosis present

## 2019-03-01 DIAGNOSIS — S36031A Moderate laceration of spleen, initial encounter: Principal | ICD-10-CM | POA: Diagnosis present

## 2019-03-01 DIAGNOSIS — F10129 Alcohol abuse with intoxication, unspecified: Secondary | ICD-10-CM | POA: Diagnosis present

## 2019-03-01 DIAGNOSIS — R52 Pain, unspecified: Secondary | ICD-10-CM

## 2019-03-01 DIAGNOSIS — S37031A Laceration of right kidney, unspecified degree, initial encounter: Secondary | ICD-10-CM

## 2019-03-01 DIAGNOSIS — E2749 Other adrenocortical insufficiency: Secondary | ICD-10-CM

## 2019-03-01 DIAGNOSIS — S37041A Minor laceration of right kidney, initial encounter: Secondary | ICD-10-CM | POA: Diagnosis present

## 2019-03-01 DIAGNOSIS — S37812A Contusion of adrenal gland, initial encounter: Secondary | ICD-10-CM | POA: Diagnosis present

## 2019-03-01 DIAGNOSIS — M25511 Pain in right shoulder: Secondary | ICD-10-CM

## 2019-03-01 DIAGNOSIS — S20312A Abrasion of left front wall of thorax, initial encounter: Secondary | ICD-10-CM | POA: Diagnosis present

## 2019-03-01 DIAGNOSIS — S36039A Unspecified laceration of spleen, initial encounter: Secondary | ICD-10-CM | POA: Diagnosis present

## 2019-03-01 DIAGNOSIS — K661 Hemoperitoneum: Secondary | ICD-10-CM | POA: Diagnosis present

## 2019-03-01 DIAGNOSIS — F1010 Alcohol abuse, uncomplicated: Secondary | ICD-10-CM

## 2019-03-01 DIAGNOSIS — F10929 Alcohol use, unspecified with intoxication, unspecified: Secondary | ICD-10-CM

## 2019-03-01 DIAGNOSIS — S025XXA Fracture of tooth (traumatic), initial encounter for closed fracture: Secondary | ICD-10-CM | POA: Diagnosis present

## 2019-03-01 DIAGNOSIS — Z20828 Contact with and (suspected) exposure to other viral communicable diseases: Secondary | ICD-10-CM | POA: Diagnosis present

## 2019-03-01 DIAGNOSIS — T1490XA Injury, unspecified, initial encounter: Secondary | ICD-10-CM

## 2019-03-01 LAB — URINALYSIS, ROUTINE W REFLEX MICROSCOPIC
Bacteria, UA: NONE SEEN
Bilirubin Urine: NEGATIVE
Glucose, UA: NEGATIVE mg/dL
Ketones, ur: NEGATIVE mg/dL
Leukocytes,Ua: NEGATIVE
Nitrite: NEGATIVE
Protein, ur: 30 mg/dL — AB
Specific Gravity, Urine: 1.041 — ABNORMAL HIGH (ref 1.005–1.030)
pH: 5 (ref 5.0–8.0)

## 2019-03-01 LAB — COMPREHENSIVE METABOLIC PANEL WITH GFR
ALT: 114 U/L — ABNORMAL HIGH (ref 0–44)
AST: 125 U/L — ABNORMAL HIGH (ref 15–41)
Albumin: 4.6 g/dL (ref 3.5–5.0)
Alkaline Phosphatase: 62 U/L (ref 38–126)
Anion gap: 14 (ref 5–15)
BUN: 9 mg/dL (ref 6–20)
CO2: 22 mmol/L (ref 22–32)
Calcium: 9.1 mg/dL (ref 8.9–10.3)
Chloride: 108 mmol/L (ref 98–111)
Creatinine, Ser: 1.07 mg/dL (ref 0.61–1.24)
GFR calc Af Amer: 49 mL/min — ABNORMAL LOW
GFR calc non Af Amer: 42 mL/min — ABNORMAL LOW
Glucose, Bld: 152 mg/dL — ABNORMAL HIGH (ref 70–99)
Potassium: 3.6 mmol/L (ref 3.5–5.1)
Sodium: 144 mmol/L (ref 135–145)
Total Bilirubin: 0.9 mg/dL (ref 0.3–1.2)
Total Protein: 7.5 g/dL (ref 6.5–8.1)

## 2019-03-01 LAB — CBC
HCT: 52.4 % — ABNORMAL HIGH (ref 39.0–52.0)
Hemoglobin: 18.4 g/dL — ABNORMAL HIGH (ref 13.0–17.0)
MCH: 31.7 pg (ref 26.0–34.0)
MCHC: 35.1 g/dL (ref 30.0–36.0)
MCV: 90.3 fL (ref 80.0–100.0)
Platelets: 331 10*3/uL (ref 150–400)
RBC: 5.8 MIL/uL (ref 4.22–5.81)
RDW: 12.6 % (ref 11.5–15.5)
WBC: 17.5 10*3/uL — ABNORMAL HIGH (ref 4.0–10.5)
nRBC: 0 % (ref 0.0–0.2)

## 2019-03-01 LAB — I-STAT CHEM 8, ED
BUN: 11 mg/dL (ref 6–20)
Calcium, Ion: 0.92 mmol/L — ABNORMAL LOW (ref 1.15–1.40)
Chloride: 111 mmol/L (ref 98–111)
Creatinine, Ser: 1.3 mg/dL — ABNORMAL HIGH (ref 0.61–1.24)
Glucose, Bld: 144 mg/dL — ABNORMAL HIGH (ref 70–99)
HCT: 52 % (ref 39.0–52.0)
Hemoglobin: 17.7 g/dL — ABNORMAL HIGH (ref 13.0–17.0)
Potassium: 3.7 mmol/L (ref 3.5–5.1)
Sodium: 142 mmol/L (ref 135–145)
TCO2: 20 mmol/L — ABNORMAL LOW (ref 22–32)

## 2019-03-01 LAB — TYPE AND SCREEN
ABO/RH(D): O POS
Antibody Screen: NEGATIVE

## 2019-03-01 LAB — SAMPLE TO BLOOD BANK

## 2019-03-01 LAB — PROTIME-INR
INR: 1 (ref 0.8–1.2)
Prothrombin Time: 12.8 seconds (ref 11.4–15.2)

## 2019-03-01 LAB — ETHANOL: Alcohol, Ethyl (B): 250 mg/dL — ABNORMAL HIGH (ref <10)

## 2019-03-01 LAB — CDS SEROLOGY

## 2019-03-01 MED ORDER — ONDANSETRON HCL 4 MG/2ML IJ SOLN: 4.0000 mg | Freq: Four times a day (QID) | INTRAMUSCULAR | Status: DC | PRN

## 2019-03-01 MED ORDER — CEFAZOLIN SODIUM-DEXTROSE 2-4 GM/100ML-% IV SOLN
2.0000 g | Freq: Once | INTRAVENOUS | Status: AC
  Administered 2019-03-01: 2 g via INTRAVENOUS
  Filled 2019-03-01: qty 100

## 2019-03-01 MED ORDER — ENOXAPARIN SODIUM 40 MG/0.4ML ~~LOC~~ SOLN
40.0000 mg | Freq: Every day | SUBCUTANEOUS | Status: DC
  Administered 2019-03-02 – 2019-03-04 (×3): 40 mg via SUBCUTANEOUS
  Filled 2019-03-01 (×3): qty 0.4

## 2019-03-01 MED ORDER — LIDOCAINE HCL 2 % IJ SOLN
10.0000 mL | Freq: Once | INTRAMUSCULAR | Status: AC
  Administered 2019-03-01: 200 mg
  Filled 2019-03-01: qty 20

## 2019-03-01 MED ORDER — ONDANSETRON HCL 4 MG/2ML IJ SOLN
4.0000 mg | Freq: Once | INTRAMUSCULAR | Status: AC
  Administered 2019-03-01: 4 mg via INTRAVENOUS
  Filled 2019-03-01: qty 2

## 2019-03-01 MED ORDER — IOHEXOL 300 MG/ML  SOLN
80.0000 mL | Freq: Once | INTRAMUSCULAR | Status: AC | PRN
  Administered 2019-03-01: 80 mL via INTRAVENOUS

## 2019-03-01 MED ORDER — SODIUM CHLORIDE 0.9 % IV BOLUS
1000.0000 mL | Freq: Once | INTRAVENOUS | Status: AC
  Administered 2019-03-01: 1000 mL via INTRAVENOUS

## 2019-03-01 MED ORDER — HYDRALAZINE HCL 20 MG/ML IJ SOLN
10.0000 mg | INTRAMUSCULAR | Status: DC | PRN
  Administered 2019-03-02: 10 mg via INTRAVENOUS
  Filled 2019-03-01: qty 1

## 2019-03-01 MED ORDER — TETANUS-DIPHTH-ACELL PERTUSSIS 5-2.5-18.5 LF-MCG/0.5 IM SUSP
0.5000 mL | Freq: Once | INTRAMUSCULAR | Status: AC
  Administered 2019-03-01: 0.5 mL via INTRAMUSCULAR
  Filled 2019-03-01: qty 0.5

## 2019-03-01 MED ORDER — HYDROMORPHONE HCL 1 MG/ML IJ SOLN
1.0000 mg | INTRAMUSCULAR | Status: DC | PRN
  Administered 2019-03-02 – 2019-03-03 (×10): 1 mg via INTRAVENOUS
  Filled 2019-03-01 (×11): qty 1

## 2019-03-01 MED ORDER — ONDANSETRON 4 MG PO TBDP: 4.0000 mg | ORAL_TABLET | Freq: Four times a day (QID) | ORAL | Status: DC | PRN

## 2019-03-01 MED ORDER — MORPHINE SULFATE (PF) 4 MG/ML IV SOLN
4.0000 mg | Freq: Once | INTRAVENOUS | Status: DC
  Filled 2019-03-01 (×2): qty 1

## 2019-03-01 MED ORDER — MORPHINE SULFATE (PF) 4 MG/ML IV SOLN
4.0000 mg | Freq: Once | INTRAVENOUS | Status: AC
  Administered 2019-03-01: 4 mg via INTRAVENOUS

## 2019-03-01 NOTE — Progress Notes (Signed)
Orthopedic Tech Progress Note Patient Details:  James Reed 07/17/1877 [id number] LEVEL 2 TRAUMA Patient ID: ALFIO LOESCHER, male   DOB: 07/17/1877, 33 y.o.   MRN: [id number]   Janit Pagan 03/01/2019, 7:22 PM
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
## 2019-03-01 NOTE — ED Notes (Signed)
Pt transported to CT and X-Ray

## 2019-03-01 NOTE — ED Notes (Signed)
Pt returned from CT °
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
## 2019-03-01 NOTE — ED Notes (Signed)
Checked on pt in CT.
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
## 2019-03-01 NOTE — ED Notes (Signed)
applied 4lit [place] because patient's O2 was trending down to 87-89% on room air

## 2019-03-01 NOTE — ED Notes (Signed)
Pt still in CT
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
## 2019-03-02 ENCOUNTER — Inpatient Hospital Stay (HOSPITAL_COMMUNITY): Payer: BC Managed Care – PPO

## 2019-03-02 ENCOUNTER — Other Ambulatory Visit: Payer: Self-pay

## 2019-03-02 LAB — CBC
HCT: 47 % (ref 39.0–52.0)
Hemoglobin: 16.5 g/dL (ref 13.0–17.0)
MCH: 31.5 pg (ref 26.0–34.0)
MCHC: 35.1 g/dL (ref 30.0–36.0)
MCV: 89.7 fL (ref 80.0–100.0)
Platelets: 227 10*3/uL (ref 150–400)
RBC: 5.24 MIL/uL (ref 4.22–5.81)
RDW: 12.6 % (ref 11.5–15.5)
WBC: 15.7 10*3/uL — ABNORMAL HIGH (ref 4.0–10.5)
nRBC: 0 % (ref 0.0–0.2)

## 2019-03-02 LAB — COMPREHENSIVE METABOLIC PANEL
ALT: 107 U/L — ABNORMAL HIGH (ref 0–44)
AST: 117 U/L — ABNORMAL HIGH (ref 15–41)
Albumin: 4 g/dL (ref 3.5–5.0)
Alkaline Phosphatase: 42 U/L (ref 38–126)
Anion gap: 13 (ref 5–15)
BUN: 8 mg/dL (ref 6–20)
CO2: 18 mmol/L — ABNORMAL LOW (ref 22–32)
Calcium: 8.6 mg/dL — ABNORMAL LOW (ref 8.9–10.3)
Chloride: 111 mmol/L (ref 98–111)
Creatinine, Ser: 0.86 mg/dL (ref 0.61–1.24)
GFR calc Af Amer: >60 mL/min (ref >60)
GFR calc non Af Amer: >60 mL/min (ref >60)
Glucose, Bld: 137 mg/dL — ABNORMAL HIGH (ref 70–99)
Potassium: 3.7 mmol/L (ref 3.5–5.1)
Sodium: 142 mmol/L (ref 135–145)
Total Bilirubin: 0.8 mg/dL (ref 0.3–1.2)
Total Protein: 6.6 g/dL (ref 6.5–8.1)

## 2019-03-02 LAB — HIV ANTIBODY (ROUTINE TESTING W REFLEX): HIV Screen 4th Generation wRfx: NONREACTIVE

## 2019-03-02 LAB — SARS CORONAVIRUS 2 BY RT PCR (HOSPITAL ORDER, PERFORMED IN ~~LOC~~ HOSPITAL LAB): SARS Coronavirus 2: NEGATIVE

## 2019-03-02 LAB — ABO/RH: ABO/RH(D): O POS

## 2019-03-02 NOTE — ED Notes (Signed)
X-ray at bedside

## 2019-03-02 NOTE — ED Notes (Signed)
Report given to 6 N RN. All questions answered 

## 2019-03-02 NOTE — Plan of Care (Signed)
  Problem: Education: Goal: Knowledge of General Education information will improve Description Including pain rating scale, medication(s)/side effects and non-pharmacologic comfort measures Outcome: Progressing   
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
## 2019-03-02 NOTE — ED Notes (Signed)
Pt transported to CT ?

## 2019-03-02 NOTE — ED Notes (Signed)
Pt returned from CT °

## 2019-03-03 ENCOUNTER — Encounter (HOSPITAL_COMMUNITY): Payer: Self-pay

## 2019-03-03 LAB — CBC
HCT: 48 % (ref 39.0–52.0)
Hemoglobin: 17 g/dL (ref 13.0–17.0)
MCH: 32.1 pg (ref 26.0–34.0)
MCHC: 35.4 g/dL (ref 30.0–36.0)
MCV: 90.7 fL (ref 80.0–100.0)
Platelets: 163 10*3/uL (ref 150–400)
RBC: 5.29 MIL/uL (ref 4.22–5.81)
RDW: 12.5 % (ref 11.5–15.5)
WBC: 16.4 10*3/uL — ABNORMAL HIGH (ref 4.0–10.5)
nRBC: 0 % (ref 0.0–0.2)

## 2019-03-03 MED ORDER — SODIUM CHLORIDE 0.9 % IV SOLN
INTRAVENOUS | Status: DC
  Administered 2019-03-03 (×2): via INTRAVENOUS

## 2019-03-03 MED ORDER — LORAZEPAM 2 MG/ML IJ SOLN: 2.0000 mg | INTRAMUSCULAR | Status: DC | PRN

## 2019-03-03 MED ORDER — OXYCODONE HCL 5 MG PO TABS
5.0000 mg | ORAL_TABLET | ORAL | Status: DC | PRN
  Administered 2019-03-03 – 2019-03-04 (×5): 10 mg via ORAL
  Filled 2019-03-03 (×5): qty 2

## 2019-03-03 MED ORDER — ACETAMINOPHEN 325 MG PO TABS
650.0000 mg | ORAL_TABLET | Freq: Four times a day (QID) | ORAL | Status: DC | PRN
  Administered 2019-03-03 – 2019-03-04 (×2): 650 mg via ORAL
  Filled 2019-03-03 (×2): qty 2

## 2019-03-03 MED ORDER — VITAMIN B-1 100 MG PO TABS
100.0000 mg | ORAL_TABLET | Freq: Every day | ORAL | Status: DC
  Administered 2019-03-03 – 2019-03-04 (×2): 100 mg via ORAL
  Filled 2019-03-03 (×2): qty 1

## 2019-03-03 MED ORDER — ADULT MULTIVITAMIN W/MINERALS CH
1.0000 | ORAL_TABLET | Freq: Every day | ORAL | Status: DC
  Administered 2019-03-03 – 2019-03-04 (×2): 1 via ORAL
  Filled 2019-03-03 (×2): qty 1

## 2019-03-03 MED ORDER — FOLIC ACID 1 MG PO TABS
1.0000 mg | ORAL_TABLET | Freq: Every day | ORAL | Status: DC
  Administered 2019-03-03 – 2019-03-04 (×2): 1 mg via ORAL
  Filled 2019-03-03 (×2): qty 1

## 2019-03-03 MED ORDER — DOCUSATE SODIUM 100 MG PO CAPS
100.0000 mg | ORAL_CAPSULE | Freq: Two times a day (BID) | ORAL | Status: DC
  Administered 2019-03-03 – 2019-03-04 (×3): 100 mg via ORAL
  Filled 2019-03-03 (×3): qty 1

## 2019-03-03 NOTE — Progress Notes (Addendum)
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     LOS: 2 days      Jacinto Halim , [facility] Surgery 03/03/2019, 8:20 AM Pager: [phone]
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
## 2019-03-03 NOTE — Progress Notes (Signed)
Orthopedic Tech Progress Note Patient Details:  James Reed 04-25-1986 [id number]  Ortho Devices Type of Ortho Device: Shoulder immobilizer Ortho Device/Splint Location: URE Ortho Device/Splint Interventions: Adjustment, Application   Post Interventions Patient Tolerated: Well Instructions Provided: Care of device, Adjustment of device   Janit Pagan 03/03/2019, 10:27 AM

## 2019-03-03 NOTE — Plan of Care (Signed)
  Problem: Elimination: Goal: Will not experience complications related to urinary retention Outcome: Progressing   Problem: Pain Managment: Goal: General experience of comfort will improve Outcome: Progressing   Problem: Safety: Goal: Ability to remain free from injury will improve Outcome: Progressing   

## 2019-03-04 DIAGNOSIS — S81812A Laceration without foreign body, left lower leg, initial encounter: Secondary | ICD-10-CM

## 2019-03-04 DIAGNOSIS — M25511 Pain in right shoulder: Secondary | ICD-10-CM

## 2019-03-04 DIAGNOSIS — S37031A Laceration of right kidney, unspecified degree, initial encounter: Secondary | ICD-10-CM

## 2019-03-04 DIAGNOSIS — S025XXA Fracture of tooth (traumatic), initial encounter for closed fracture: Secondary | ICD-10-CM

## 2019-03-04 DIAGNOSIS — E2749 Other adrenocortical insufficiency: Secondary | ICD-10-CM

## 2019-03-04 DIAGNOSIS — F1010 Alcohol abuse, uncomplicated: Secondary | ICD-10-CM

## 2019-03-04 LAB — BASIC METABOLIC PANEL
Anion gap: 9 (ref 5–15)
BUN: 8 mg/dL (ref 6–20)
CO2: 27 mmol/L (ref 22–32)
Calcium: 8.8 mg/dL — ABNORMAL LOW (ref 8.9–10.3)
Chloride: 102 mmol/L (ref 98–111)
Creatinine, Ser: 0.99 mg/dL (ref 0.61–1.24)
GFR calc Af Amer: >60 mL/min (ref >60)
GFR calc non Af Amer: >60 mL/min (ref >60)
Glucose, Bld: 104 mg/dL — ABNORMAL HIGH (ref 70–99)
Potassium: 3.5 mmol/L (ref 3.5–5.1)
Sodium: 138 mmol/L (ref 135–145)

## 2019-03-04 LAB — CBC
HCT: 42 % (ref 39.0–52.0)
Hemoglobin: 14.7 g/dL (ref 13.0–17.0)
MCH: 31.7 pg (ref 26.0–34.0)
MCHC: 35 g/dL (ref 30.0–36.0)
MCV: 90.5 fL (ref 80.0–100.0)
Platelets: 156 10*3/uL (ref 150–400)
RBC: 4.64 MIL/uL (ref 4.22–5.81)
RDW: 12.4 % (ref 11.5–15.5)
WBC: 9.7 10*3/uL (ref 4.0–10.5)
nRBC: 0 % (ref 0.0–0.2)

## 2019-03-04 MED ORDER — OXYCODONE HCL 5 MG PO TABS: 5.0000 mg | ORAL_TABLET | Freq: Four times a day (QID) | ORAL | 0 refills | Status: DC | PRN

## 2019-03-04 MED ORDER — CEPHALEXIN 500 MG PO CAPS: 500.0000 mg | ORAL_CAPSULE | Freq: Four times a day (QID) | ORAL | 0 refills | Status: AC

## 2019-03-04 MED ORDER — ADULT MULTIVITAMIN W/MINERALS CH: 1.0000 | ORAL_TABLET | Freq: Every day | ORAL | Status: DC

## 2019-03-04 MED ORDER — ACETAMINOPHEN 325 MG PO TABS: 650.0000 mg | ORAL_TABLET | Freq: Four times a day (QID) | ORAL | Status: DC | PRN
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
## 2019-12-03 ENCOUNTER — Other Ambulatory Visit: Payer: Self-pay

## 2019-12-03 ENCOUNTER — Encounter (HOSPITAL_COMMUNITY): Payer: Self-pay | Admitting: Emergency Medicine

## 2019-12-03 ENCOUNTER — Emergency Department (HOSPITAL_COMMUNITY)
Admission: EM | Admit: 2019-12-03 | Discharge: 2019-12-03 | Disposition: A | Discharge status: Home / Self Care | Payer: 59 | Attending: Emergency Medicine | Admitting: Emergency Medicine

## 2019-12-03 DIAGNOSIS — R1013 Epigastric pain: Secondary | ICD-10-CM | POA: Diagnosis not present

## 2019-12-03 DIAGNOSIS — Z79899 Other long term (current) drug therapy: Secondary | ICD-10-CM | POA: Insufficient documentation

## 2019-12-03 DIAGNOSIS — R0789 Other chest pain: Secondary | ICD-10-CM | POA: Insufficient documentation

## 2019-12-03 LAB — CBC WITH DIFFERENTIAL/PLATELET
Abs Immature Granulocytes: 0.04 10*3/uL (ref 0.00–0.07)
Basophils Absolute: 0.1 10*3/uL (ref 0.0–0.1)
Basophils Relative: 1 %
Eosinophils Absolute: 0 10*3/uL (ref 0.0–0.5)
Eosinophils Relative: 0 %
HCT: 54.1 % — ABNORMAL HIGH (ref 39.0–52.0)
Hemoglobin: 19 g/dL — ABNORMAL HIGH (ref 13.0–17.0)
Immature Granulocytes: 1 %
Lymphocytes Relative: 17 %
Lymphs Abs: 1.4 10*3/uL (ref 0.7–4.0)
MCH: 31.5 pg (ref 26.0–34.0)
MCHC: 35.1 g/dL (ref 30.0–36.0)
MCV: 89.7 fL (ref 80.0–100.0)
Monocytes Absolute: 0.9 10*3/uL (ref 0.1–1.0)
Monocytes Relative: 11 %
Neutro Abs: 5.7 10*3/uL (ref 1.7–7.7)
Neutrophils Relative %: 70 %
Platelets: 250 10*3/uL (ref 150–400)
RBC: 6.03 MIL/uL — ABNORMAL HIGH (ref 4.22–5.81)
RDW: 12.5 % (ref 11.5–15.5)
WBC: 8.1 10*3/uL (ref 4.0–10.5)
nRBC: 0 % (ref 0.0–0.2)

## 2019-12-03 LAB — COMPREHENSIVE METABOLIC PANEL
ALT: 31 U/L (ref 0–44)
AST: 20 U/L (ref 15–41)
Albumin: 4.4 g/dL (ref 3.5–5.0)
Alkaline Phosphatase: 63 U/L (ref 38–126)
Anion gap: 13 (ref 5–15)
BUN: 7 mg/dL (ref 6–20)
CO2: 23 mmol/L (ref 22–32)
Calcium: 9.5 mg/dL (ref 8.9–10.3)
Chloride: 106 mmol/L (ref 98–111)
Creatinine, Ser: 0.89 mg/dL (ref 0.61–1.24)
GFR calc Af Amer: >60 mL/min (ref >60)
GFR calc non Af Amer: >60 mL/min (ref >60)
Glucose, Bld: 104 mg/dL — ABNORMAL HIGH (ref 70–99)
Potassium: 4.2 mmol/L (ref 3.5–5.1)
Sodium: 142 mmol/L (ref 135–145)
Total Bilirubin: 0.6 mg/dL (ref 0.3–1.2)
Total Protein: 7.5 g/dL (ref 6.5–8.1)

## 2019-12-03 LAB — D-DIMER, QUANTITATIVE: D-Dimer, Quant: <0.27 ug/mL-FEU (ref 0.00–0.50)

## 2019-12-03 LAB — LIPASE, BLOOD: Lipase: 23 U/L (ref 11–51)

## 2019-12-03 LAB — TROPONIN I (HIGH SENSITIVITY)
Troponin I (High Sensitivity): <2 ng/L (ref <18)
Troponin I (High Sensitivity): <2 ng/L (ref <18)

## 2019-12-03 LAB — ETHANOL: Alcohol, Ethyl (B): 10 mg/dL — ABNORMAL HIGH (ref <10)

## 2019-12-03 MED ORDER — SODIUM CHLORIDE 0.9 % IV BOLUS
1000.0000 mL | Freq: Once | INTRAVENOUS | Status: AC
  Administered 2019-12-03: 1000 mL via INTRAVENOUS

## 2019-12-03 MED ORDER — SUCRALFATE 1 G PO TABS
1.0000 g | ORAL_TABLET | Freq: Three times a day (TID) | ORAL | 0 refills | Status: DC
Start: 2019-12-03 — End: 2020-04-22

## 2019-12-03 MED ORDER — OMEPRAZOLE 20 MG PO CPDR
20.0000 mg | DELAYED_RELEASE_CAPSULE | Freq: Every day | ORAL | 0 refills | Status: DC
Start: 2019-12-03 — End: 2020-04-22

## 2019-12-03 MED ORDER — SUCRALFATE 1 GM/10ML PO SUSP
1.0000 g | Freq: Once | ORAL | Status: AC
  Administered 2019-12-03: 1 g via ORAL
  Filled 2019-12-03: qty 10

## 2019-12-03 MED ORDER — FAMOTIDINE IN NACL 20-0.9 MG/50ML-% IV SOLN
20.0000 mg | Freq: Once | INTRAVENOUS | Status: AC
  Administered 2019-12-03: 20 mg via INTRAVENOUS
  Filled 2019-12-03: qty 50
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
## 2019-12-03 NOTE — Discharge Instructions (Signed)
As discussed, your evaluation today has been largely reassuring.  But, it is important that you monitor your condition carefully, and do not hesitate to return to the ED if you develop new, or concerning changes in your condition. ? ?Otherwise, please follow-up with your physician for appropriate ongoing care. ? ?

## 2019-12-03 NOTE — ED Triage Notes (Signed)
PT states when he woke up this morning around 0730 he was having a cold sweat and epigastric pain that radiated into his back and last about 30 minutes. EMS gave 324 of aspirin and started a saline lock in route. PT states he is now pain free.

## 2020-04-10 ENCOUNTER — Encounter (HOSPITAL_COMMUNITY): Payer: Self-pay | Admitting: Emergency Medicine

## 2020-04-10 ENCOUNTER — Emergency Department (HOSPITAL_COMMUNITY)
Admission: EM | Admit: 2020-04-10 | Discharge: 2020-04-11 | Disposition: A | Discharge status: Home / Self Care | Payer: Self-pay | Attending: Emergency Medicine | Admitting: Emergency Medicine

## 2020-04-10 ENCOUNTER — Emergency Department (HOSPITAL_COMMUNITY): Payer: Self-pay

## 2020-04-10 ENCOUNTER — Other Ambulatory Visit: Payer: Self-pay

## 2020-04-10 DIAGNOSIS — Y9301 Activity, walking, marching and hiking: Secondary | ICD-10-CM | POA: Insufficient documentation

## 2020-04-10 DIAGNOSIS — S62321B Displaced fracture of shaft of second metacarpal bone, left hand, initial encounter for open fracture: Secondary | ICD-10-CM | POA: Insufficient documentation

## 2020-04-10 DIAGNOSIS — Z23 Encounter for immunization: Secondary | ICD-10-CM | POA: Insufficient documentation

## 2020-04-10 DIAGNOSIS — Y92410 Unspecified street and highway as the place of occurrence of the external cause: Secondary | ICD-10-CM | POA: Insufficient documentation

## 2020-04-10 NOTE — ED Notes (Signed)
Patient transported to X-ray 
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
## 2020-04-11 MED ORDER — CEFAZOLIN SODIUM 1 G IJ SOLR
1.0000 g | Freq: Once | INTRAMUSCULAR | Status: AC
  Administered 2020-04-11: 1 g via INTRAMUSCULAR
  Filled 2020-04-11: qty 10

## 2020-04-11 MED ORDER — TETANUS-DIPHTH-ACELL PERTUSSIS 5-2.5-18.5 LF-MCG/0.5 IM SUSP
0.5000 mL | Freq: Once | INTRAMUSCULAR | Status: AC
  Administered 2020-04-11: 0.5 mL via INTRAMUSCULAR
  Filled 2020-04-11: qty 0.5

## 2020-04-11 MED ORDER — DOXYCYCLINE HYCLATE 100 MG PO CAPS
100.0000 mg | ORAL_CAPSULE | Freq: Two times a day (BID) | ORAL | 0 refills | Status: DC
Start: 2020-04-11 — End: 2022-04-13
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
## 2020-04-22 ENCOUNTER — Other Ambulatory Visit: Payer: Self-pay

## 2020-04-22 ENCOUNTER — Other Ambulatory Visit: Payer: Self-pay | Admitting: Orthopedic Surgery

## 2020-04-22 ENCOUNTER — Encounter (HOSPITAL_BASED_OUTPATIENT_CLINIC_OR_DEPARTMENT_OTHER): Payer: Self-pay | Admitting: Orthopedic Surgery

## 2020-04-23 ENCOUNTER — Other Ambulatory Visit (HOSPITAL_COMMUNITY): Payer: Self-pay

## 2020-04-24 ENCOUNTER — Other Ambulatory Visit (HOSPITAL_COMMUNITY)
Admission: RE | Admit: 2020-04-24 | Discharge: 2020-04-24 | Disposition: A | Discharge status: Home / Self Care | Payer: HRSA Program | Source: Ambulatory Visit | Attending: Orthopedic Surgery | Admitting: Orthopedic Surgery

## 2020-04-24 DIAGNOSIS — Z20822 Contact with and (suspected) exposure to covid-19: Secondary | ICD-10-CM | POA: Diagnosis not present

## 2020-04-24 DIAGNOSIS — Z01812 Encounter for preprocedural laboratory examination: Secondary | ICD-10-CM | POA: Insufficient documentation

## 2020-04-24 LAB — SARS CORONAVIRUS 2 (TAT 6-24 HRS): SARS Coronavirus 2: NEGATIVE

## 2020-04-27 ENCOUNTER — Encounter (HOSPITAL_BASED_OUTPATIENT_CLINIC_OR_DEPARTMENT_OTHER): Payer: Self-pay | Admitting: Orthopedic Surgery

## 2020-04-27 ENCOUNTER — Encounter (HOSPITAL_BASED_OUTPATIENT_CLINIC_OR_DEPARTMENT_OTHER)
Admission: RE | Disposition: A | Discharge status: Home / Self Care | Payer: Self-pay | Source: Home / Self Care | Attending: Orthopedic Surgery

## 2020-04-27 ENCOUNTER — Other Ambulatory Visit: Payer: Self-pay

## 2020-04-27 ENCOUNTER — Encounter (HOSPITAL_COMMUNITY): Payer: Self-pay | Admitting: Anesthesiology

## 2020-04-27 ENCOUNTER — Other Ambulatory Visit: Payer: Self-pay | Admitting: Orthopedic Surgery

## 2020-04-27 ENCOUNTER — Ambulatory Visit (HOSPITAL_BASED_OUTPATIENT_CLINIC_OR_DEPARTMENT_OTHER)
Admission: RE | Admit: 2020-04-27 | Discharge: 2020-04-27 | Disposition: A | Discharge status: Home / Self Care | Payer: Self-pay | Attending: Orthopedic Surgery | Admitting: Orthopedic Surgery

## 2020-04-27 DIAGNOSIS — X58XXXA Exposure to other specified factors, initial encounter: Secondary | ICD-10-CM | POA: Insufficient documentation

## 2020-04-27 DIAGNOSIS — S62321A Displaced fracture of shaft of second metacarpal bone, left hand, initial encounter for closed fracture: Secondary | ICD-10-CM | POA: Insufficient documentation

## 2020-04-27 DIAGNOSIS — Z5309 Procedure and treatment not carried out because of other contraindication: Secondary | ICD-10-CM | POA: Insufficient documentation

## 2020-04-27 SURGERY — CANCELLED PROCEDURE
Anesthesia: Choice | Site: Index Finger | Laterality: Left

## 2020-04-27 MED ORDER — LACTATED RINGERS IV SOLN: INTRAVENOUS | Status: DC

## 2020-04-27 MED ORDER — CEFAZOLIN SODIUM-DEXTROSE 2-4 GM/100ML-% IV SOLN: 2.0000 g | INTRAVENOUS | Status: DC

## 2020-04-27 MED ORDER — MIDAZOLAM HCL 2 MG/2ML IJ SOLN
INTRAMUSCULAR | Status: AC
  Filled 2020-04-27: qty 2

## 2020-04-27 MED ORDER — CEFAZOLIN SODIUM-DEXTROSE 2-4 GM/100ML-% IV SOLN
INTRAVENOUS | Status: AC
  Filled 2020-04-27: qty 100

## 2020-04-27 MED ORDER — FENTANYL CITRATE (PF) 100 MCG/2ML IJ SOLN
INTRAMUSCULAR | Status: AC
  Filled 2020-04-27: qty 2

## 2020-04-27 NOTE — Progress Notes (Signed)
Pt states he drank a V8 drink at 1200 today 10/12/202.  RN notified Dr. Clemens Catholic, and procedure cancelled.  Pt instructed to call Dr. Barnie Mort office to reschedule surgery.  Pt verbalized understandings.

## 2020-04-28 ENCOUNTER — Other Ambulatory Visit: Payer: Self-pay | Admitting: Orthopedic Surgery

## 2020-04-28 ENCOUNTER — Other Ambulatory Visit (HOSPITAL_COMMUNITY)
Admission: RE | Admit: 2020-04-28 | Discharge: 2020-04-28 | Disposition: A | Discharge status: Home / Self Care | Payer: HRSA Program | Source: Ambulatory Visit | Attending: Orthopedic Surgery | Admitting: Orthopedic Surgery

## 2020-04-28 DIAGNOSIS — Z20822 Contact with and (suspected) exposure to covid-19: Secondary | ICD-10-CM | POA: Diagnosis not present

## 2020-04-28 DIAGNOSIS — Z01812 Encounter for preprocedural laboratory examination: Secondary | ICD-10-CM | POA: Diagnosis present

## 2020-04-28 LAB — SARS CORONAVIRUS 2 (TAT 6-24 HRS): SARS Coronavirus 2: NEGATIVE

## 2020-04-28 NOTE — Progress Notes (Signed)

## 2020-04-29 ENCOUNTER — Ambulatory Visit (HOSPITAL_BASED_OUTPATIENT_CLINIC_OR_DEPARTMENT_OTHER): Payer: Self-pay | Admitting: Anesthesiology

## 2020-04-29 ENCOUNTER — Encounter (HOSPITAL_BASED_OUTPATIENT_CLINIC_OR_DEPARTMENT_OTHER)
Admission: RE | Disposition: A | Discharge status: Home / Self Care | Payer: Self-pay | Source: Home / Self Care | Attending: Orthopedic Surgery

## 2020-04-29 ENCOUNTER — Other Ambulatory Visit: Payer: Self-pay

## 2020-04-29 ENCOUNTER — Encounter (HOSPITAL_BASED_OUTPATIENT_CLINIC_OR_DEPARTMENT_OTHER): Payer: Self-pay | Admitting: Orthopedic Surgery

## 2020-04-29 ENCOUNTER — Ambulatory Visit (HOSPITAL_BASED_OUTPATIENT_CLINIC_OR_DEPARTMENT_OTHER)
Admission: RE | Admit: 2020-04-29 | Discharge: 2020-04-29 | Disposition: A | Discharge status: Home / Self Care | Payer: Self-pay | Attending: Orthopedic Surgery | Admitting: Orthopedic Surgery

## 2020-04-29 DIAGNOSIS — W228XXA Striking against or struck by other objects, initial encounter: Secondary | ICD-10-CM | POA: Insufficient documentation

## 2020-04-29 DIAGNOSIS — S62321A Displaced fracture of shaft of second metacarpal bone, left hand, initial encounter for closed fracture: Secondary | ICD-10-CM | POA: Insufficient documentation

## 2020-04-29 HISTORY — PX: OPEN REDUCTION INTERNAL FIXATION (ORIF) METACARPAL: SHX6234

## 2020-04-29 HISTORY — PX: INCISION AND DRAINAGE OF WOUND: SHX1803

## 2020-04-29 SURGERY — IRRIGATION AND DEBRIDEMENT WOUND
Anesthesia: Monitor Anesthesia Care | Site: Index Finger | Laterality: Left

## 2020-04-29 MED ORDER — MIDAZOLAM HCL 2 MG/2ML IJ SOLN
INTRAMUSCULAR | Status: AC
  Filled 2020-04-29: qty 2

## 2020-04-29 MED ORDER — CEFAZOLIN SODIUM-DEXTROSE 2-4 GM/100ML-% IV SOLN
INTRAVENOUS | Status: AC
  Filled 2020-04-29: qty 100

## 2020-04-29 MED ORDER — LACTATED RINGERS IV SOLN: INTRAVENOUS | Status: DC

## 2020-04-29 MED ORDER — FENTANYL CITRATE (PF) 100 MCG/2ML IJ SOLN: 25.0000 ug | INTRAMUSCULAR | Status: DC | PRN

## 2020-04-29 MED ORDER — FENTANYL CITRATE (PF) 100 MCG/2ML IJ SOLN
INTRAMUSCULAR | Status: AC
  Filled 2020-04-29: qty 2

## 2020-04-29 MED ORDER — ROPIVACAINE HCL 7.5 MG/ML IJ SOLN
INTRAMUSCULAR | Status: DC | PRN
  Administered 2020-04-29: 30 mL via PERINEURAL

## 2020-04-29 MED ORDER — FENTANYL CITRATE (PF) 100 MCG/2ML IJ SOLN: 50.0000 ug | Freq: Once | INTRAMUSCULAR | Status: AC

## 2020-04-29 MED ORDER — PROPOFOL 500 MG/50ML IV EMUL
INTRAVENOUS | Status: AC
  Filled 2020-04-29: qty 50

## 2020-04-29 MED ORDER — CEFAZOLIN SODIUM-DEXTROSE 2-4 GM/100ML-% IV SOLN
2.0000 g | INTRAVENOUS | Status: AC
  Administered 2020-04-29: 2 g via INTRAVENOUS

## 2020-04-29 MED ORDER — PROMETHAZINE HCL 25 MG/ML IJ SOLN: 6.2500 mg | INTRAMUSCULAR | Status: DC | PRN

## 2020-04-29 MED ORDER — MIDAZOLAM HCL 2 MG/2ML IJ SOLN
2.0000 mg | Freq: Once | INTRAMUSCULAR | Status: AC
  Administered 2020-04-29: 2 mg via INTRAVENOUS

## 2020-04-29 MED ORDER — FENTANYL CITRATE (PF) 100 MCG/2ML IJ SOLN
50.0000 ug | Freq: Once | INTRAMUSCULAR | Status: AC
  Administered 2020-04-29: 50 ug via INTRAVENOUS

## 2020-04-29 MED ORDER — ACETAMINOPHEN 500 MG PO TABS
ORAL_TABLET | ORAL | Status: AC
  Filled 2020-04-29: qty 2

## 2020-04-29 MED ORDER — MIDAZOLAM HCL 2 MG/2ML IJ SOLN: 1.0000 mg | Freq: Once | INTRAMUSCULAR | Status: AC

## 2020-04-29 MED ORDER — ONDANSETRON HCL 4 MG/2ML IJ SOLN
INTRAMUSCULAR | Status: DC | PRN
  Administered 2020-04-29: 4 mg via INTRAVENOUS

## 2020-04-29 MED ORDER — HYDROCODONE-ACETAMINOPHEN 5-325 MG PO TABS
ORAL_TABLET | ORAL | 0 refills | Status: DC
Start: 2020-04-29 — End: 2022-04-13

## 2020-04-29 MED ORDER — LIDOCAINE HCL (CARDIAC) PF 100 MG/5ML IV SOSY
PREFILLED_SYRINGE | INTRAVENOUS | Status: DC | PRN
  Administered 2020-04-29: 40 mg via INTRAVENOUS

## 2020-04-29 MED ORDER — FENTANYL CITRATE (PF) 100 MCG/2ML IJ SOLN
INTRAMUSCULAR | Status: DC | PRN
Start: 2020-04-29 — End: 2020-04-29
  Administered 2020-04-29 (×2): 25 ug via INTRAVENOUS

## 2020-04-29 MED ORDER — ACETAMINOPHEN 500 MG PO TABS
1000.0000 mg | ORAL_TABLET | Freq: Once | ORAL | Status: AC
  Administered 2020-04-29: 1000 mg via ORAL

## 2020-04-29 MED ORDER — FENTANYL CITRATE (PF) 100 MCG/2ML IJ SOLN
INTRAMUSCULAR | Status: AC
Start: 2020-04-29 — End: ?
  Filled 2020-04-29: qty 2

## 2020-04-29 MED ORDER — PROPOFOL 500 MG/50ML IV EMUL
INTRAVENOUS | Status: DC | PRN
  Administered 2020-04-29: 75 ug/kg/min via INTRAVENOUS

## 2020-04-29 MED ORDER — PROPOFOL 10 MG/ML IV BOLUS
INTRAVENOUS | Status: AC
  Filled 2020-04-29: qty 20

## 2020-04-29 MED ORDER — ONDANSETRON HCL 4 MG/2ML IJ SOLN
INTRAMUSCULAR | Status: AC
  Filled 2020-04-29: qty 2

## 2020-04-29 SURGICAL SUPPLY — 72 items
BAG DECANTER FOR FLEXI CONT (MISCELLANEOUS) IMPLANT
BIT DRILL QC 1.5 (BIT) ×4 IMPLANT
BLADE MINI RND TIP GREEN BEAV (BLADE) IMPLANT
BLADE SURG 15 STRL LF DISP TIS (BLADE) ×4 IMPLANT
BLADE SURG 15 STRL SS (BLADE) ×8
BNDG COHESIVE 1X5 TAN STRL LF (GAUZE/BANDAGES/DRESSINGS) IMPLANT
BNDG ELASTIC 2X5.8 VLCR STR LF (GAUZE/BANDAGES/DRESSINGS) IMPLANT
BNDG ELASTIC 3X5.8 VLCR STR LF (GAUZE/BANDAGES/DRESSINGS) ×4 IMPLANT
BNDG ESMARK 4X9 LF (GAUZE/BANDAGES/DRESSINGS) ×4 IMPLANT
BNDG GAUZE 1X2.1 STRL (MISCELLANEOUS) IMPLANT
BNDG GAUZE ELAST 4 BULKY (GAUZE/BANDAGES/DRESSINGS) ×4 IMPLANT
CHLORAPREP W/TINT 26 (MISCELLANEOUS) ×4 IMPLANT
CORD BIPOLAR FORCEPS 12FT (ELECTRODE) ×4 IMPLANT
COVER BACK TABLE 60X90IN (DRAPES) ×4 IMPLANT
COVER MAYO STAND STRL (DRAPES) ×4 IMPLANT
COVER WAND RF STERILE (DRAPES) IMPLANT
CUFF TOURN SGL QUICK 18X4 (TOURNIQUET CUFF) ×4 IMPLANT
DRAPE EXTREMITY T 121X128X90 (DISPOSABLE) ×4 IMPLANT
DRAPE OEC MINIVIEW 54X84 (DRAPES) ×4 IMPLANT
DRAPE SURG 17X23 STRL (DRAPES) ×4 IMPLANT
GAUZE PACKING IODOFORM 1/4X15 (PACKING) IMPLANT
GAUZE SPONGE 4X4 12PLY STRL (GAUZE/BANDAGES/DRESSINGS) ×4 IMPLANT
GAUZE XEROFORM 1X8 LF (GAUZE/BANDAGES/DRESSINGS) ×4 IMPLANT
GLOVE BIO SURGEON STRL SZ 6.5 (GLOVE) ×3 IMPLANT
GLOVE BIO SURGEON STRL SZ7.5 (GLOVE) ×4 IMPLANT
GLOVE BIO SURGEONS STRL SZ 6.5 (GLOVE) ×1
GLOVE BIOGEL PI IND STRL 7.0 (GLOVE) ×2 IMPLANT
GLOVE BIOGEL PI IND STRL 8 (GLOVE) ×2 IMPLANT
GLOVE BIOGEL PI IND STRL 8.5 (GLOVE) ×2 IMPLANT
GLOVE BIOGEL PI INDICATOR 7.0 (GLOVE) ×2
GLOVE BIOGEL PI INDICATOR 8 (GLOVE) ×2
GLOVE BIOGEL PI INDICATOR 8.5 (GLOVE) ×2
GLOVE SURG ORTHO 8.0 STRL STRW (GLOVE) ×4 IMPLANT
GOWN STRL REUS W/ TWL LRG LVL3 (GOWN DISPOSABLE) ×2 IMPLANT
GOWN STRL REUS W/TWL LRG LVL3 (GOWN DISPOSABLE) ×4
GOWN STRL REUS W/TWL XL LVL3 (GOWN DISPOSABLE) ×4 IMPLANT
LOOP VESSEL MAXI BLUE (MISCELLANEOUS) IMPLANT
NEEDLE HYPO 25X1 1.5 SAFETY (NEEDLE) IMPLANT
NS IRRIG 1000ML POUR BTL (IV SOLUTION) ×4 IMPLANT
PACK BASIN DAY SURGERY FS (CUSTOM PROCEDURE TRAY) ×4 IMPLANT
PAD CAST 3X4 CTTN HI CHSV (CAST SUPPLIES) IMPLANT
PAD CAST 4YDX4 CTTN HI CHSV (CAST SUPPLIES) ×2 IMPLANT
PADDING CAST ABS 4INX4YD NS (CAST SUPPLIES) ×2
PADDING CAST ABS COTTON 4X4 ST (CAST SUPPLIES) ×2 IMPLANT
PADDING CAST COTTON 3X4 STRL (CAST SUPPLIES)
PADDING CAST COTTON 4X4 STRL (CAST SUPPLIES) ×4
PLATE LOCK STR 2.0 6H (Plate) ×4 IMPLANT
SCREW FT 2.0X10 (Screw) ×12 IMPLANT
SCREW FT 2.0X12 (Screw) ×4 IMPLANT
SCREW FT 2.0X13 (Screw) ×4 IMPLANT
SCREW FT 2.1X11 (Screw) ×8 IMPLANT
SLEEVE SCD COMPRESS KNEE MED (MISCELLANEOUS) ×4 IMPLANT
SPLINT PLASTER CAST XFAST 3X15 (CAST SUPPLIES) IMPLANT
SPLINT PLASTER CAST XFAST 4X15 (CAST SUPPLIES) IMPLANT
SPLINT PLASTER XTRA FAST SET 4 (CAST SUPPLIES)
SPLINT PLASTER XTRA FASTSET 3X (CAST SUPPLIES)
STOCKINETTE 4X48 STRL (DRAPES) ×4 IMPLANT
SUT CHROMIC 4 0 PS 2 18 (SUTURE) ×4 IMPLANT
SUT ETHILON 3 0 PS 1 (SUTURE) IMPLANT
SUT ETHILON 4 0 PS 2 18 (SUTURE) ×4 IMPLANT
SUT MERSILENE 4 0 P 3 (SUTURE) IMPLANT
SUT VIC AB 3-0 PS1 18 (SUTURE)
SUT VIC AB 3-0 PS1 18XBRD (SUTURE) IMPLANT
SUT VICRYL 4-0 PS2 18IN ABS (SUTURE) ×4 IMPLANT
SWAB COLLECTION DEVICE MRSA (MISCELLANEOUS) IMPLANT
SWAB CULTURE ESWAB REG 1ML (MISCELLANEOUS) IMPLANT
SYR BULB EAR ULCER 3OZ GRN STR (SYRINGE) ×4 IMPLANT
SYR CONTROL 10ML LL (SYRINGE) IMPLANT
SYR TOOMEY 50ML (SYRINGE) IMPLANT
TOWEL GREEN STERILE FF (TOWEL DISPOSABLE) ×8 IMPLANT
TUBE FEEDING ENTERAL 5FR 16IN (TUBING) IMPLANT
UNDERPAD 30X36 HEAVY ABSORB (UNDERPADS AND DIAPERS) ×4 IMPLANT

## 2020-04-29 NOTE — Discharge Instructions (Addendum)
Next dose of Tylenol at 6 PM  [facility] Center Instructions Hand Surgery  Wound Care: Keep your hand elevated above the level of your heart.  Do not allow it to dangle by your side.  Keep the dressing dry and do not remove it unless your doctor advises you to do so.  He will usually change it at the time of your post-op visit.  Moving your fingers is advised to stimulate circulation but will depend on the site of your surgery.  If you have a splint applied, your doctor will advise you regarding movement.  Activity: Do not drive or operate machinery today.  Rest today and then you may return to your normal activity and work as indicated by your physician.  Diet:  Drink liquids today or eat a light diet.  You may resume a regular diet tomorrow.    General expectations: Pain for two to three days. Fingers may become slightly swollen.  Call your doctor if any of the following occur: Severe pain not relieved by pain medication. Elevated temperature. Dressing soaked with blood. Inability to move fingers. White or bluish color to fingers.   Post Anesthesia Home Care Instructions  Activity: Get plenty of rest for the remainder of the day. A responsible individual must stay with you for 24 hours following the procedure.  For the next 24 hours, DO NOT: -Drive a car -Advertising copywriter -Drink alcoholic beverages -Take any medication unless instructed by your physician -Make any legal decisions or sign important papers.  Meals: Start with liquid foods such as gelatin or soup. Progress to regular foods as tolerated. Avoid greasy, spicy, heavy foods. If nausea and/or vomiting occur, drink only clear liquids until the nausea and/or vomiting subsides. Call your physician if vomiting continues.  Special Instructions/Symptoms: Your throat may feel dry or sore from the anesthesia or the breathing tube placed in your throat during surgery. If this causes discomfort, gargle with warm salt water. The  discomfort should disappear within 24 hours.  If you had a scopolamine patch placed behind your ear for the management of post- operative nausea and/or vomiting:  1. The medication in the patch is effective for 72 hours, after which it should be removed.  Wrap patch in a tissue and discard in the trash. Wash hands thoroughly with soap and water. 2. You may remove the patch earlier than 72 hours if you experience unpleasant side effects which may include dry mouth, dizziness or visual disturbances. 3. Avoid touching the patch. Wash your hands with soap and water after contact with the patch.    Regional Anesthesia Blocks  1. Numbness or the inability to move the "blocked" extremity may last from 3-48 hours after placement. The length of time depends on the medication injected and your individual response to the medication. If the numbness is not going away after 48 hours, call your surgeon.  2. The extremity that is blocked will need to be protected until the numbness is gone and the  Strength has returned. Because you cannot feel it, you will need to take extra care to avoid injury. Because it may be weak, you may have difficulty moving it or using it. You may not know what position it is in without looking at it while the block is in effect.  3. For blocks in the legs and feet, returning to weight bearing and walking needs to be done carefully. You will need to wait until the numbness is entirely gone and the strength has returned.  You should be able to move your leg and foot normally before you try and bear weight or walk. You will need someone to be with you when you first try to ensure you do not fall and possibly risk injury.  4. Bruising and tenderness at the needle site are common side effects and will resolve in a few days.  5. Persistent numbness or new problems with movement should be communicated to the surgeon or the [facility] [phone] [facility]  [phone]).

## 2020-04-29 NOTE — Progress Notes (Signed)
Assisted Dr. Turk with left, ultrasound guided, axillary block. Side rails up, monitors on throughout procedure. See vital signs in flow sheet. Tolerated Procedure well. 

## 2020-04-29 NOTE — Anesthesia Procedure Notes (Signed)
Anesthesia Regional Block: Axillary brachial plexus block   Pre-Anesthetic Checklist: ,, timeout performed, Correct Patient, Correct Site, Correct Laterality, Correct Procedure, Correct Position, site marked, Risks and benefits discussed,  Surgical consent,  Pre-op evaluation,  At surgeon's request and post-op pain management  Laterality: Left  Prep: chloraprep       Needles:  Injection technique: Single-shot  Needle Type: Echogenic Needle     Needle Length: 9cm  Needle Gauge: 21     Additional Needles:   Procedures:,,,, ultrasound used (permanent image in chart),,,,  Narrative:  Start time: 04/29/2020 1:30 PM End time: 04/29/2020 1:35 PM Injection made incrementally with aspirations every 5 mL.  Performed by: Personally  Anesthesiologist: Cecile Hearing, MD  Additional Notes: No pain on injection. No increased resistance to injection. Injection made in 5cc increments.  Good needle visualization.  Patient tolerated procedure well.

## 2020-04-29 NOTE — Op Note (Signed)
I assisted Surgeon(s) and Role:    * Betha Loa, MD - Primary    Cindee Salt, MD on the Procedure(s): IRRIGATION AND DEBRIDEMENT WOUND OPEN REDUCTION INTERNAL FIXATION (ORIF) METACARPAL on 04/29/2020.  I provided assistance on this case as follows: setup, approach, identification, debridement, reduction, stabilization, and application of the plate and screws, closure of the wound and application of the dressings and splint.  Electronically signed by: Cindee Salt, MD Date: 04/29/2020 Time: 3:09 PM
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
## 2020-04-29 NOTE — Transfer of Care (Signed)
Immediate Anesthesia Transfer of Care Note  Patient: James Reed  Procedure(s) Performed: IRRIGATION AND DEBRIDEMENT WOUND (Left Index Finger) OPEN REDUCTION INTERNAL FIXATION (ORIF) METACARPAL (Left Finger)  Patient Location: PACU  Anesthesia Type:MAC combined with regional for post-op pain  Level of Consciousness: awake, alert , oriented and patient cooperative  Airway & Oxygen Therapy: Patient Spontanous Breathing and Patient connected to face mask oxygen  Post-op Assessment: Report given to RN and Post -op Vital signs reviewed and stable  Post vital signs: Reviewed and stable  Last Vitals:  Vitals Value Taken Time  BP    Temp    Pulse 91 04/29/20 1513  Resp 17 04/29/20 1513  SpO2 96 % 04/29/20 1513  Vitals shown include unvalidated device data.  Last Pain:  Vitals:   04/29/20 1335  TempSrc:   PainSc: 0-No pain      Patients Stated Pain Goal: 3 (17/49/44 9675)  Complications: No complications documented.
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
## 2020-04-30 NOTE — Anesthesia Postprocedure Evaluation (Signed)
Anesthesia Post Note  Patient: James Reed  Procedure(s) Performed: IRRIGATION AND DEBRIDEMENT WOUND (Left Index Finger) OPEN REDUCTION INTERNAL FIXATION (ORIF) METACARPAL (Left Finger)     Patient location during evaluation: PACU Anesthesia Type: Regional and MAC Level of consciousness: awake and alert Pain management: pain level controlled Vital Signs Assessment: post-procedure vital signs reviewed and stable Respiratory status: spontaneous breathing, nonlabored ventilation, respiratory function stable and patient connected to nasal cannula oxygen Cardiovascular status: stable and blood pressure returned to baseline Postop Assessment: no apparent nausea or vomiting Anesthetic complications: no   No complications documented.  Last Vitals:  Vitals:   04/29/20 1545 04/29/20 1610  BP: (!) 129/97 135/89  Pulse: 83 68  Resp: 20 18  Temp:  36.4 C  SpO2: 95% 98%    Last Pain:  Vitals:   04/29/20 1610  TempSrc: Oral  PainSc: 0-No pain                 Ashlynne Shetterly S

## 2020-05-03 ENCOUNTER — Encounter (HOSPITAL_BASED_OUTPATIENT_CLINIC_OR_DEPARTMENT_OTHER): Payer: Self-pay | Admitting: Orthopedic Surgery
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
## 2022-04-12 ENCOUNTER — Other Ambulatory Visit: Payer: Self-pay

## 2022-04-12 ENCOUNTER — Emergency Department (HOSPITAL_COMMUNITY): Payer: Self-pay

## 2022-04-12 ENCOUNTER — Encounter (HOSPITAL_COMMUNITY): Payer: Self-pay | Admitting: *Deleted

## 2022-04-12 DIAGNOSIS — S52331A Displaced oblique fracture of shaft of right radius, initial encounter for closed fracture: Secondary | ICD-10-CM | POA: Insufficient documentation

## 2022-04-12 DIAGNOSIS — S59911A Unspecified injury of right forearm, initial encounter: Secondary | ICD-10-CM | POA: Diagnosis present

## 2022-04-12 DIAGNOSIS — Y92410 Unspecified street and highway as the place of occurrence of the external cause: Secondary | ICD-10-CM | POA: Diagnosis not present
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
## 2022-04-13 ENCOUNTER — Encounter: Payer: Self-pay | Admitting: Orthopedic Surgery

## 2022-04-13 ENCOUNTER — Emergency Department (HOSPITAL_COMMUNITY)
Admission: EM | Admit: 2022-04-13 | Discharge: 2022-04-13 | Disposition: A | Discharge status: Home / Self Care | Payer: Self-pay | Attending: Emergency Medicine | Admitting: Emergency Medicine

## 2022-04-13 ENCOUNTER — Ambulatory Visit (INDEPENDENT_AMBULATORY_CARE_PROVIDER_SITE_OTHER): Payer: Self-pay

## 2022-04-13 ENCOUNTER — Telehealth: Payer: Self-pay | Admitting: Radiology

## 2022-04-13 ENCOUNTER — Ambulatory Visit (INDEPENDENT_AMBULATORY_CARE_PROVIDER_SITE_OTHER): Payer: Self-pay | Admitting: Orthopedic Surgery

## 2022-04-13 VITALS — BP 144/104 | HR 91 | Ht 67.0 in | Wt 210.0 lb

## 2022-04-13 DIAGNOSIS — S52531A Colles' fracture of right radius, initial encounter for closed fracture: Secondary | ICD-10-CM

## 2022-04-13 DIAGNOSIS — S52371D Galeazzi's fracture of right radius, subsequent encounter for closed fracture with routine healing: Secondary | ICD-10-CM

## 2022-04-13 MED ORDER — OXYCODONE-ACETAMINOPHEN 5-325 MG PO TABS
2.0000 | ORAL_TABLET | Freq: Once | ORAL | Status: AC
  Administered 2022-04-13: 2 via ORAL
  Filled 2022-04-13: qty 2

## 2022-04-13 MED ORDER — OXYCODONE-ACETAMINOPHEN 5-325 MG PO TABS: 1.0000 | ORAL_TABLET | Freq: Four times a day (QID) | ORAL | 0 refills | Status: DC | PRN

## 2022-04-13 MED ORDER — DOXYCYCLINE HYCLATE 100 MG PO CAPS: 100.0000 mg | ORAL_CAPSULE | Freq: Two times a day (BID) | ORAL | 0 refills | Status: DC

## 2022-04-13 NOTE — Telephone Encounter (Signed)
-----   Message from Josue Hector sent at 04/13/2022  3:57 PM EDT ----- Dr. Ruthe Mannan block has dropped for 10/3 and there is another case booked for that day.  He can't do this case on this day.  Let me know what you would like to do.  Thanks,  Hoyle Sauer

## 2022-04-13 NOTE — Discharge Instructions (Addendum)
Ice for 20 minutes every 2 hours while awake for the next 2 days.  Percocet as prescribed as needed for pain.  Follow-up with Dr. Aline Brochure in the next 1 to 2 days.  Call his office in the morning to make these arrangements.

## 2022-04-13 NOTE — Patient Instructions (Signed)
Your surgery will be at [facility] by Dr Harrison  The hospital will contact you with a preoperative appointment to discuss Anesthesia.  Please arrive on time or 15 minutes early for the preoperative appointment, they have a very tight schedule if you are late or do not come in your surgery will be cancelled.  The phone number is [phone]. Please bring your medications with you for the appointment. They will tell you the arrival time and medication instructions when you have your preoperative evaluation. Do not wear nail polish the day of your surgery and if you take Phentermine you need to stop this medication ONE WEEK prior to your surgery. If you take Invokana, Farxiga, Jardiance, or Steglatro) - Hold 72 hours before the procedure.  If you take Ozempic,  Bydureon or Trulicity do not take for 8 days before your surgery. If you take Victoza, Rybelsis, Saxenda or Adlyxi stop 24 hours before the procedure.  Please arrive at the hospital 2 hours before procedure if scheduled at 9:30 or later in the day or at the time the nurse tells you at your preoperative visit.   If you have my chart do not use the time given in my chart use the time given to you by the nurse during your preoperative visit.   Your surgery  time may change. Please be available for phone calls the day of your surgery and the day before. The Short Stay department may need to discuss changes about your surgery time. Not reaching the you could lead to procedure delays and possible cancellation.  You must have a ride home and someone to stay with you for 24 to 48 hours. The person taking you home will receive and sign for the your discharge instructions.  Please be prepared to give your support person's name and telephone number to Central Registration. Dr Harrison will need that name and phone number post procedure.   
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
## 2022-04-13 NOTE — Telephone Encounter (Signed)
I sent a message to cleo

## 2022-04-14 NOTE — Patient Instructions (Signed)
Your procedure is scheduled on: 04/18/2022  Report to [facility] at   11:45  AM.  Call this number if you have problems the morning of surgery: [phone]   Remember:   Do not Eat or Drink after midnight         No Smoking the morning of surgery  :  Take these medicines the morning of surgery with A SIP OF WATER: oxycodone   Do not wear jewelry, make-up or nail polish.  Do not wear lotions, powders, or perfumes. You may wear deodorant.  Do not shave 48 hours prior to surgery. Men may shave face and neck.  Do not bring valuables to the hospital.  Contacts, dentures or bridgework may not be worn into surgery.  Leave suitcase in the car. After surgery it may be brought to your room.  For patients admitted to the hospital, checkout time is 11:00 AM the day of discharge.   Patients discharged the day of surgery will not be allowed to drive home.    Special Instructions: Shower using CHG night before surgery and shower the day of surgery use CHG.  Use special wash - you have one bottle of CHG for all showers.  You should use approximately 1/2 of the bottle for each shower.  How to Use Chlorhexidine Before Surgery Chlorhexidine gluconate (CHG) is a germ-killing (antiseptic) solution that is used to clean the skin. It can get rid of the bacteria that normally live on the skin and can keep them away for about 24 hours. To clean your skin with CHG, you may be given: A CHG solution to use in the shower or as part of a sponge bath. A prepackaged cloth that contains CHG. Cleaning your skin with CHG may help lower the risk for infection: While you are staying in the intensive care unit of the hospital. If you have a vascular access, such as a central line, to provide short-term or long-term access to your veins. If you have a catheter to drain urine from your bladder. If you are on a ventilator. A ventilator is a machine that helps you breathe by moving air in and out of your  lungs. After surgery. What are the risks? Risks of using CHG include: A skin reaction. Hearing loss, if CHG gets in your ears and you have a perforated eardrum. Eye injury, if CHG gets in your eyes and is not rinsed out. The CHG product catching fire. Make sure that you avoid smoking and flames after applying CHG to your skin. Do not use CHG: If you have a chlorhexidine allergy or have previously reacted to chlorhexidine. On babies younger than 19 months of age. How to use CHG solution Use CHG only as told by your health care provider, and follow the instructions on the label. Use the full amount of CHG as directed. Usually, this is one bottle. During a shower Follow these steps when using CHG solution during a shower (unless your health care provider gives you different instructions): Start the shower. Use your normal soap and shampoo to wash your face and hair. Turn off the shower or move out of the shower stream. Pour the CHG onto a clean washcloth. Do not use any type of brush or rough-edged sponge. Starting at your neck, lather your body down to your toes. Make sure you follow these instructions: If you will be having surgery, pay special attention to the part of your body where you will be having surgery. Scrub  this area for at least 1 minute. Do not use CHG on your head or face. If the solution gets into your ears or eyes, rinse them well with water. Avoid your genital area. Avoid any areas of skin that have broken skin, cuts, or scrapes. Scrub your back and under your arms. Make sure to wash skin folds. Let the lather sit on your skin for 1-2 minutes or as long as told by your health care provider. Thoroughly rinse your entire body in the shower. Make sure that all body creases and crevices are rinsed well. Dry off with a clean towel. Do not put any substances on your body afterward--such as powder, lotion, or perfume--unless you are told to do so by your health care provider.  Only use lotions that are recommended by the manufacturer. Put on clean clothes or pajamas. If it is the night before your surgery, sleep in clean sheets.  During a sponge bath Follow these steps when using CHG solution during a sponge bath (unless your health care provider gives you different instructions): Use your normal soap and shampoo to wash your face and hair. Pour the CHG onto a clean washcloth. Starting at your neck, lather your body down to your toes. Make sure you follow these instructions: If you will be having surgery, pay special attention to the part of your body where you will be having surgery. Scrub this area for at least 1 minute. Do not use CHG on your head or face. If the solution gets into your ears or eyes, rinse them well with water. Avoid your genital area. Avoid any areas of skin that have broken skin, cuts, or scrapes. Scrub your back and under your arms. Make sure to wash skin folds. Let the lather sit on your skin for 1-2 minutes or as long as told by your health care provider. Using a different clean, wet washcloth, thoroughly rinse your entire body. Make sure that all body creases and crevices are rinsed well. Dry off with a clean towel. Do not put any substances on your body afterward--such as powder, lotion, or perfume--unless you are told to do so by your health care provider. Only use lotions that are recommended by the manufacturer. Put on clean clothes or pajamas. If it is the night before your surgery, sleep in clean sheets. How to use CHG prepackaged cloths Only use CHG cloths as told by your health care provider, and follow the instructions on the label. Use the CHG cloth on clean, dry skin. Do not use the CHG cloth on your head or face unless your health care provider tells you to. When washing with the CHG cloth: Avoid your genital area. Avoid any areas of skin that have broken skin, cuts, or scrapes. Before surgery Follow these steps when using a  CHG cloth to clean before surgery (unless your health care provider gives you different instructions): Using the CHG cloth, vigorously scrub the part of your body where you will be having surgery. Scrub using a back-and-forth motion for 3 minutes. The area on your body should be completely wet with CHG when you are done scrubbing. Do not rinse. Discard the cloth and let the area air-dry. Do not put any substances on the area afterward, such as powder, lotion, or perfume. Put on clean clothes or pajamas. If it is the night before your surgery, sleep in clean sheets.  For general bathing Follow these steps when using CHG cloths for general bathing (unless your health care provider  gives you different instructions). Use a separate CHG cloth for each area of your body. Make sure you wash between any folds of skin and between your fingers and toes. Wash your body in the following order, switching to a new cloth after each step: The front of your neck, shoulders, and chest. Both of your arms, under your arms, and your hands. Your stomach and groin area, avoiding the genitals. Your right leg and foot. Your left leg and foot. The back of your neck, your back, and your buttocks. Do not rinse. Discard the cloth and let the area air-dry. Do not put any substances on your body afterward--such as powder, lotion, or perfume--unless you are told to do so by your health care provider. Only use lotions that are recommended by the manufacturer. Put on clean clothes or pajamas. Contact a health care provider if: Your skin gets irritated after scrubbing. You have questions about using your solution or cloth. You swallow any chlorhexidine. Call your local poison control center (1-(406)288-5366 in the [place] Get help right away if: Your eyes itch badly, or they become very red or swollen. Your skin itches badly and is red or swollen. Your hearing changes. You have trouble seeing. You have swelling or tingling in  your mouth or throat. You have trouble breathing. These symptoms may represent a serious problem that is an emergency. Do not wait to see if the symptoms will go away. Get medical help right away. Call your local emergency services (911 in the [place] Do not drive yourself to the hospital. Summary Chlorhexidine gluconate (CHG) is a germ-killing (antiseptic) solution that is used to clean the skin. Cleaning your skin with CHG may help to lower your risk for infection. You may be given CHG to use for bathing. It may be in a bottle or in a prepackaged cloth to use on your skin. Carefully follow your health care provider's instructions and the instructions on the product label. Do not use CHG if you have a chlorhexidine allergy. Contact your health care provider if your skin gets irritated after scrubbing. This information is not intended to replace advice given to you by your health care provider. Make sure you discuss any questions you have with your health care provider. Document Revised: 10/31/2021 Document Reviewed: 09/13/2020 Elsevier Patient Education  [place]. Wrist Fracture Treated With ORIF, Care After This sheet gives you information about how to care for yourself after your procedure. Your health care provider may also give you more specific instructions. If you have problems or questions, contact your health care provider. What can I expect after the procedure? After the procedure, it is common to have: Pain. Swelling. Stiffness. A small amount of drainage from the incision. Follow these instructions at home: If you have a cast: Do not stick anything inside the cast to scratch your skin. Doing that increases your risk of infection. Check the skin around the cast every day. Tell your health care provider about any concerns. You may put lotion on dry skin around the edges of the cast. Do not put lotion on the skin underneath the cast. Keep the cast clean and dry. If you have  a splint or sling: Wear the splint or sling as told by your health care provider. Remove it only as told by your health care provider. Loosen the splint or sling if your fingers tingle, become numb, or turn cold and blue. Keep the splint or sling clean and dry. Bathing Do not take baths,  swim, or use a hot tub until your health care provider approves. Ask your health care provider if you may take showers. You may only be allowed to take sponge baths. If your cast, splint, or sling is not waterproof: Do not let it get wet. Cover it with a watertight covering when you take a bath or shower. If you have a sling, remove it for bathing only if your health care provider tells you it is safe to do that. Keep the bandage (dressing) dry until your health care provider says it can be removed. Incision care  Follow instructions from your health care provider about how to take care of your incision. Make sure you: Wash your hands with soap and water for at least 20 seconds before and after you change your dressing. If soap and water are not available, use hand sanitizer. Change your dressing as told by your health care provider. Leave stitches (sutures), skin glue, or adhesive strips in place. These skin closures may need to stay in place for 2 weeks or longer. If adhesive strip edges start to loosen and curl up, you may trim the loose edges. Do not remove adhesive strips completely unless your health care provider tells you to do that. Check your incision area every day for signs of infection. Check for: Redness. More swelling or pain. Blood or more fluid. Warmth. Pus or a bad smell. Managing pain, stiffness, and swelling  If directed, put ice on the injured area. To do this: If you have a removable splint or sling, remove it as told by your health care provider. Put ice in a plastic bag. Place a towel between your skin and the bag or between your cast and the bag. Leave the ice on for 20 minutes,  2-3 times a day. Remove the ice if your skin turns bright red. This is very important. If you cannot feel pain, heat, or cold, you have a greater risk of damage to the area. Move your fingers often to reduce stiffness and swelling. Raise (elevate) the injured area above the level of your heart while you are sitting or lying down. Driving If you were given a sedative during the procedure, it can affect you for several hours. Do not drive or operate machinery until your health care provider says that it is safe. Ask your health care provider when it is safe to drive if you have a cast, splint, or sling on your wrist. Activity Return to your normal activities as told by your health care provider. Ask your health care provider what activities are safe for you. Do exercises as told by your health care provider. Do not lift with or put weight on your injured wrist until your health care provider approves. Avoid pulling and pushing. Medicines Take over-the-counter and prescription medicines only as told by your health care provider. Ask your health care provider if the medicine prescribed to you: Requires you to avoid driving or using machinery. Can cause constipation. You may need to take these actions to prevent or treat constipation: Drink enough fluid to keep your urine pale yellow. Take over-the-counter or prescription medicines. Eat foods that are high in fiber, such as beans, whole grains, and fresh fruits and vegetables. Limit foods that are high in fat and processed sugars, such as fried or sweet foods. General instructions Do not put pressure on any part of the cast or splint until it is fully hardened. This may take several hours. Do not use any products that  contain nicotine or tobacco, such as cigarettes, e-cigarettes, and chewing tobacco. These can delay bone healing after surgery. If you need help quitting, ask your health care provider. Keep all follow-up visits. This is  important. Contact a health care provider if: Your cast, splint, or sling is damaged or loose. Your pain is not controlled with medicine. You have any of these signs of infection: A fever. Redness around your incision. More swelling or pain around your incision. Blood or more fluid coming from your incision. Warmth coming from your incision. Pus or a bad smell coming from your incision or your dressing. You develop a rash. Get help right away if: Your skin or fingers on your injured arm turn blue or gray. Your arm feels cold or numb. You have severe pain in your injured wrist. You have trouble breathing. You feel faint or light-headed. Summary After the procedure, it is common to have pain, swelling, stiffness, and a small amount of drainage from the incision. You may use ice, elevation, and pain medicine as told by your health care provider to reduce pain and swelling. Wear your splint or sling as told by your health care provider. Do not lift with or put weight on your injured wrist until your health care provider approves. This information is not intended to replace advice given to you by your health care provider. Make sure you discuss any questions you have with your health care provider. Document Revised: 10/14/2019 Document Reviewed: 10/14/2019 Elsevier Patient Education  [place] Anesthesia, Adult, Care After The following information offers guidance on how to care for yourself after your procedure. Your health care provider may also give you more specific instructions. If you have problems or questions, contact your health care provider. What can I expect after the procedure? After the procedure, it is common for people to: Have pain or discomfort at the IV site. Have nausea or vomiting. Have a sore throat or hoarseness. Have trouble concentrating. Feel cold or chills. Feel weak, sleepy, or tired (fatigue). Have soreness and body aches. These can affect  parts of the body that were not involved in surgery. Follow these instructions at home: For the time period you were told by your health care provider:  Rest. Do not participate in activities where you could fall or become injured. Do not drive or use machinery. Do not drink alcohol. Do not take sleeping pills or medicines that cause drowsiness. Do not make important decisions or sign legal documents. Do not take care of children on your own. General instructions Drink enough fluid to keep your urine pale yellow. If you have sleep apnea, surgery and certain medicines can increase your risk for breathing problems. Follow instructions from your health care provider about wearing your sleep device: Anytime you are sleeping, including during daytime naps. While taking prescription pain medicines, sleeping medicines, or medicines that make you drowsy. Return to your normal activities as told by your health care provider. Ask your health care provider what activities are safe for you. Take over-the-counter and prescription medicines only as told by your health care provider. Do not use any products that contain nicotine or tobacco. These products include cigarettes, chewing tobacco, and vaping devices, such as e-cigarettes. These can delay incision healing after surgery. If you need help quitting, ask your health care provider. Contact a health care provider if: You have nausea or vomiting that does not get better with medicine. You vomit every time you eat or drink. You have pain  that does not get better with medicine. You cannot urinate or have bloody urine. You develop a skin rash. You have a fever. Get help right away if: You have trouble breathing. You have chest pain. You vomit blood. These symptoms may be an emergency. Get help right away. Call 911. Do not wait to see if the symptoms will go away. Do not drive yourself to the hospital. Summary After the procedure, it is common to  have a sore throat, hoarseness, nausea, vomiting, or to feel weak, sleepy, or fatigue. For the time period you were told by your health care provider, do not drive or use machinery. Get help right away if you have difficulty breathing, have chest pain, or vomit blood. These symptoms may be an emergency. This information is not intended to replace advice given to you by your health care provider. Make sure you discuss any questions you have with your health care provider. Document Revised: 09/30/2021 Document Reviewed: 09/30/2021 Elsevier Patient Education  [place].

## 2022-04-17 ENCOUNTER — Encounter (HOSPITAL_COMMUNITY)
Admission: RE | Admit: 2022-04-17 | Discharge: 2022-04-17 | Disposition: A | Discharge status: Home / Self Care | Payer: Self-pay | Source: Ambulatory Visit | Attending: Orthopedic Surgery | Admitting: Orthopedic Surgery

## 2022-04-17 DIAGNOSIS — X58XXXD Exposure to other specified factors, subsequent encounter: Secondary | ICD-10-CM | POA: Insufficient documentation

## 2022-04-17 DIAGNOSIS — S52371D Galeazzi's fracture of right radius, subsequent encounter for closed fracture with routine healing: Secondary | ICD-10-CM | POA: Insufficient documentation

## 2022-04-17 DIAGNOSIS — Z01812 Encounter for preprocedural laboratory examination: Secondary | ICD-10-CM | POA: Insufficient documentation

## 2022-04-17 LAB — BASIC METABOLIC PANEL
Anion gap: 7 (ref 5–15)
BUN: 14 mg/dL (ref 6–20)
CO2: 27 mmol/L (ref 22–32)
Calcium: 9.1 mg/dL (ref 8.9–10.3)
Chloride: 105 mmol/L (ref 98–111)
Creatinine, Ser: 1.22 mg/dL (ref 0.61–1.24)
GFR, Estimated: >60 mL/min (ref >60)
Glucose, Bld: 94 mg/dL (ref 70–99)
Potassium: 4.1 mmol/L (ref 3.5–5.1)
Sodium: 139 mmol/L (ref 135–145)

## 2022-04-17 LAB — CBC WITH DIFFERENTIAL/PLATELET
Abs Immature Granulocytes: 0.03 10*3/uL (ref 0.00–0.07)
Basophils Absolute: 0 10*3/uL (ref 0.0–0.1)
Basophils Relative: 0 %
Eosinophils Absolute: 0.1 10*3/uL (ref 0.0–0.5)
Eosinophils Relative: 1 %
HCT: 48.8 % (ref 39.0–52.0)
Hemoglobin: 17.3 g/dL — ABNORMAL HIGH (ref 13.0–17.0)
Immature Granulocytes: 1 %
Lymphocytes Relative: 24 %
Lymphs Abs: 1.6 10*3/uL (ref 0.7–4.0)
MCH: 32.2 pg (ref 26.0–34.0)
MCHC: 35.5 g/dL (ref 30.0–36.0)
MCV: 90.7 fL (ref 80.0–100.0)
Monocytes Absolute: 1 10*3/uL (ref 0.1–1.0)
Monocytes Relative: 16 %
Neutro Abs: 3.9 10*3/uL (ref 1.7–7.7)
Neutrophils Relative %: 58 %
Platelets: 214 10*3/uL (ref 150–400)
RBC: 5.38 MIL/uL (ref 4.22–5.81)
RDW: 12.5 % (ref 11.5–15.5)
WBC: 6.6 10*3/uL (ref 4.0–10.5)
nRBC: 0 % (ref 0.0–0.2)
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
## 2022-04-18 ENCOUNTER — Ambulatory Visit (HOSPITAL_BASED_OUTPATIENT_CLINIC_OR_DEPARTMENT_OTHER): Payer: Self-pay | Admitting: Anesthesiology

## 2022-04-18 ENCOUNTER — Other Ambulatory Visit: Payer: Self-pay

## 2022-04-18 ENCOUNTER — Encounter (HOSPITAL_COMMUNITY): Payer: Self-pay | Admitting: Orthopedic Surgery

## 2022-04-18 ENCOUNTER — Encounter (HOSPITAL_COMMUNITY)
Admission: RE | Disposition: A | Discharge status: Home / Self Care | Payer: Self-pay | Source: Home / Self Care | Attending: Orthopedic Surgery

## 2022-04-18 ENCOUNTER — Ambulatory Visit (HOSPITAL_COMMUNITY): Payer: Self-pay | Admitting: Anesthesiology

## 2022-04-18 ENCOUNTER — Ambulatory Visit (HOSPITAL_COMMUNITY)
Admission: RE | Admit: 2022-04-18 | Discharge: 2022-04-18 | Disposition: A | Discharge status: Home / Self Care | Payer: Self-pay | Attending: Orthopedic Surgery | Admitting: Orthopedic Surgery

## 2022-04-18 ENCOUNTER — Ambulatory Visit (HOSPITAL_COMMUNITY): Payer: Self-pay

## 2022-04-18 DIAGNOSIS — S5291XD Unspecified fracture of right forearm, subsequent encounter for closed fracture with routine healing: Secondary | ICD-10-CM

## 2022-04-18 DIAGNOSIS — K219 Gastro-esophageal reflux disease without esophagitis: Secondary | ICD-10-CM | POA: Insufficient documentation

## 2022-04-18 DIAGNOSIS — S5291XA Unspecified fracture of right forearm, initial encounter for closed fracture: Secondary | ICD-10-CM

## 2022-04-18 DIAGNOSIS — S52501A Unspecified fracture of the lower end of right radius, initial encounter for closed fracture: Secondary | ICD-10-CM | POA: Insufficient documentation

## 2022-04-18 DIAGNOSIS — S52331A Displaced oblique fracture of shaft of right radius, initial encounter for closed fracture: Secondary | ICD-10-CM | POA: Insufficient documentation

## 2022-04-18 DIAGNOSIS — W5532XA Struck by other hoof stock, initial encounter: Secondary | ICD-10-CM | POA: Insufficient documentation

## 2022-04-18 HISTORY — PX: ORIF RADIAL FRACTURE: SHX5113

## 2022-04-18 SURGERY — OPEN REDUCTION INTERNAL FIXATION (ORIF) RADIAL FRACTURE
Anesthesia: General | Site: Arm Lower | Laterality: Right

## 2022-04-18 MED ORDER — LACTATED RINGERS IV SOLN: INTRAVENOUS | Status: DC | PRN

## 2022-04-18 MED ORDER — SEVOFLURANE IN SOLN
RESPIRATORY_TRACT | Status: AC
  Filled 2022-04-18: qty 250

## 2022-04-18 MED ORDER — HYDROMORPHONE HCL 1 MG/ML IJ SOLN
INTRAMUSCULAR | Status: DC | PRN
  Administered 2022-04-18 (×2): .5 mg via INTRAVENOUS

## 2022-04-18 MED ORDER — DEXAMETHASONE SODIUM PHOSPHATE 10 MG/ML IJ SOLN
INTRAMUSCULAR | Status: DC | PRN
  Administered 2022-04-18: 10 mg via INTRAVENOUS

## 2022-04-18 MED ORDER — KETAMINE HCL 10 MG/ML IJ SOLN
INTRAMUSCULAR | Status: DC | PRN
  Administered 2022-04-18: 20 mg via INTRAVENOUS

## 2022-04-18 MED ORDER — MIDAZOLAM HCL 2 MG/2ML IJ SOLN
INTRAMUSCULAR | Status: DC | PRN
  Administered 2022-04-18: 2 mg via INTRAVENOUS

## 2022-04-18 MED ORDER — CEFAZOLIN SODIUM-DEXTROSE 2-4 GM/100ML-% IV SOLN
INTRAVENOUS | Status: AC
  Filled 2022-04-18: qty 100

## 2022-04-18 MED ORDER — METOCLOPRAMIDE HCL 5 MG/ML IJ SOLN
INTRAMUSCULAR | Status: AC
  Filled 2022-04-18: qty 2

## 2022-04-18 MED ORDER — PROPOFOL 10 MG/ML IV BOLUS
INTRAVENOUS | Status: DC | PRN
  Administered 2022-04-18: 280 mg via INTRAVENOUS

## 2022-04-18 MED ORDER — FENTANYL CITRATE (PF) 100 MCG/2ML IJ SOLN
INTRAMUSCULAR | Status: AC
  Administered 2022-04-18: 50 ug
  Filled 2022-04-18: qty 2

## 2022-04-18 MED ORDER — MIDAZOLAM HCL 2 MG/2ML IJ SOLN
INTRAMUSCULAR | Status: AC
  Filled 2022-04-18: qty 2

## 2022-04-18 MED ORDER — FENTANYL CITRATE (PF) 250 MCG/5ML IJ SOLN
INTRAMUSCULAR | Status: DC | PRN
  Administered 2022-04-18: 25 ug via INTRAVENOUS
  Administered 2022-04-18: 50 ug via INTRAVENOUS
  Administered 2022-04-18 (×5): 25 ug via INTRAVENOUS

## 2022-04-18 MED ORDER — FENTANYL CITRATE (PF) 100 MCG/2ML IJ SOLN
INTRAMUSCULAR | Status: AC
  Filled 2022-04-18: qty 2

## 2022-04-18 MED ORDER — PROPOFOL 10 MG/ML IV BOLUS
INTRAVENOUS | Status: AC
  Filled 2022-04-18: qty 20

## 2022-04-18 MED ORDER — LIDOCAINE HCL (PF) 2 % IJ SOLN
INTRAMUSCULAR | Status: AC
  Filled 2022-04-18: qty 5

## 2022-04-18 MED ORDER — ONDANSETRON HCL 4 MG/2ML IJ SOLN: 4.0000 mg | Freq: Once | INTRAMUSCULAR | Status: DC | PRN

## 2022-04-18 MED ORDER — KETAMINE HCL 10 MG/ML IJ SOLN
INTRAMUSCULAR | Status: AC
  Filled 2022-04-18: qty 1

## 2022-04-18 MED ORDER — OXYCODONE HCL 5 MG PO TABS: 5.0000 mg | ORAL_TABLET | ORAL | 0 refills | Status: DC | PRN

## 2022-04-18 MED ORDER — CHLORHEXIDINE GLUCONATE 0.12 % MT SOLN: 15.0000 mL | Freq: Once | OROMUCOSAL | Status: AC

## 2022-04-18 MED ORDER — LIDOCAINE HCL (CARDIAC) PF 100 MG/5ML IV SOSY
PREFILLED_SYRINGE | INTRAVENOUS | Status: DC | PRN
  Administered 2022-04-18: 60 mg via INTRAVENOUS

## 2022-04-18 MED ORDER — HYDROMORPHONE HCL 1 MG/ML IJ SOLN
0.2500 mg | INTRAMUSCULAR | Status: AC | PRN
  Administered 2022-04-18 (×4): 0.5 mg via INTRAVENOUS
  Filled 2022-04-18 (×4): qty 0.5

## 2022-04-18 MED ORDER — METOCLOPRAMIDE HCL 5 MG/ML IJ SOLN
INTRAMUSCULAR | Status: DC | PRN
  Administered 2022-04-18: 10 mg via INTRAVENOUS

## 2022-04-18 MED ORDER — DEXAMETHASONE SODIUM PHOSPHATE 10 MG/ML IJ SOLN
INTRAMUSCULAR | Status: AC
  Filled 2022-04-18: qty 1

## 2022-04-18 MED ORDER — FENTANYL CITRATE (PF) 100 MCG/2ML IJ SOLN
100.0000 ug | Freq: Once | INTRAMUSCULAR | Status: AC
  Administered 2022-04-18: 50 ug via INTRAVENOUS

## 2022-04-18 MED ORDER — MEPERIDINE HCL 50 MG/ML IJ SOLN: 6.2500 mg | INTRAMUSCULAR | Status: DC | PRN

## 2022-04-18 MED ORDER — MIDAZOLAM HCL 2 MG/2ML IJ SOLN: 2.0000 mg | Freq: Once | INTRAMUSCULAR | Status: DC

## 2022-04-18 MED ORDER — LACTATED RINGERS IV SOLN: INTRAVENOUS | Status: DC

## 2022-04-18 MED ORDER — ONDANSETRON HCL 4 MG/2ML IJ SOLN
INTRAMUSCULAR | Status: DC | PRN
  Administered 2022-04-18: 4 mg via INTRAVENOUS

## 2022-04-18 MED ORDER — SODIUM CHLORIDE 0.9 % IR SOLN
Status: DC | PRN
  Administered 2022-04-18: 1

## 2022-04-18 MED ORDER — ONDANSETRON HCL 4 MG/2ML IJ SOLN
INTRAMUSCULAR | Status: AC
  Filled 2022-04-18: qty 2

## 2022-04-18 MED ORDER — ORAL CARE MOUTH RINSE
15.0000 mL | Freq: Once | OROMUCOSAL | Status: AC
  Administered 2022-04-18: 15 mL via OROMUCOSAL

## 2022-04-18 MED ORDER — HYDROMORPHONE HCL 1 MG/ML IJ SOLN
INTRAMUSCULAR | Status: AC
  Filled 2022-04-18: qty 1

## 2022-04-18 MED ORDER — KETAMINE HCL 50 MG/5ML IJ SOSY
PREFILLED_SYRINGE | INTRAMUSCULAR | Status: AC
  Filled 2022-04-18: qty 5

## 2022-04-18 MED ORDER — CEFAZOLIN SODIUM-DEXTROSE 2-4 GM/100ML-% IV SOLN
2.0000 g | INTRAVENOUS | Status: AC
  Administered 2022-04-18: 2 g via INTRAVENOUS

## 2022-04-18 MED ORDER — OXYCODONE-ACETAMINOPHEN 5-325 MG PO TABS
1.0000 | ORAL_TABLET | Freq: Once | ORAL | Status: AC
  Administered 2022-04-18: 1 via ORAL
  Filled 2022-04-18: qty 1

## 2022-04-18 MED ORDER — BUPIVACAINE-EPINEPHRINE (PF) 0.5% -1:200000 IJ SOLN
INTRAMUSCULAR | Status: DC | PRN
  Administered 2022-04-18: 20 mL via PERINEURAL

## 2022-04-18 SURGICAL SUPPLY — 56 items
APL PRP STRL LF DISP 70% ISPRP (MISCELLANEOUS) ×1
APL SKNCLS STERI-STRIP NONHPOA (GAUZE/BANDAGES/DRESSINGS) ×1
BANDAGE ESMARK 4X12 BL STRL LF (DISPOSABLE) ×1 IMPLANT
BENZOIN TINCTURE PRP APPL 2/3 (GAUZE/BANDAGES/DRESSINGS) ×1 IMPLANT
BIT DRILL 2.5X110 QC LCP DISP (BIT) IMPLANT
BIT DRILL 2.8 (BIT) ×1
BIT DRILL CANN QC 2.8X165 (BIT) IMPLANT
BLADE SURG SZ10 CARB STEEL (BLADE) ×1 IMPLANT
BNDG CMPR 12X4 ELC STRL LF (DISPOSABLE) ×1
BNDG CMPR STD VLCR NS LF 5.8X3 (GAUZE/BANDAGES/DRESSINGS) ×2
BNDG COHESIVE 4X5 TAN STRL (GAUZE/BANDAGES/DRESSINGS) ×1 IMPLANT
BNDG ELASTIC 3X5.8 VLCR NS LF (GAUZE/BANDAGES/DRESSINGS) IMPLANT
BNDG ESMARK 4X12 BLUE STRL LF (DISPOSABLE) ×1
CHLORAPREP W/TINT 26 (MISCELLANEOUS) ×1 IMPLANT
CLOTH BEACON ORANGE TIMEOUT ST (SAFETY) ×1 IMPLANT
CLSR STERI-STRIP ANTIMIC 1/2X4 (GAUZE/BANDAGES/DRESSINGS) ×1 IMPLANT
COVER LIGHT HANDLE STERIS (MISCELLANEOUS) ×2 IMPLANT
COVER PROBE W GEL 5X96 (DRAPES) ×1 IMPLANT
CUFF TOURN SGL QUICK 18X4 (TOURNIQUET CUFF) ×1 IMPLANT
DECANTER SPIKE VIAL GLASS SM (MISCELLANEOUS) ×1 IMPLANT
DRAPE C-ARM FOLDED MOBILE STRL (DRAPES) ×1 IMPLANT
DRILL BIT 2.8MM (BIT) ×1
ELECT REM PT RETURN 9FT ADLT (ELECTROSURGICAL) ×1
ELECTRODE REM PT RTRN 9FT ADLT (ELECTROSURGICAL) ×1 IMPLANT
GAUZE SPONGE 4X4 12PLY STRL (GAUZE/BANDAGES/DRESSINGS) ×1 IMPLANT
GAUZE XEROFORM 1X8 LF (GAUZE/BANDAGES/DRESSINGS) IMPLANT
GLOVE BIOGEL PI IND STRL 7.0 (GLOVE) ×2 IMPLANT
GLOVE SS N UNI LF 8.5 STRL (GLOVE) ×1 IMPLANT
GLOVE SURG POLYISO LF SZ8 (GLOVE) ×1 IMPLANT
GOWN STRL REUS W/TWL LRG LVL3 (GOWN DISPOSABLE) ×2 IMPLANT
GOWN STRL REUS W/TWL XL LVL3 (GOWN DISPOSABLE) ×1 IMPLANT
KIT TURNOVER KIT A (KITS) ×1 IMPLANT
MANIFOLD NEPTUNE II (INSTRUMENTS) ×1 IMPLANT
NDL HYPO 21X1.5 SAFETY (NEEDLE) ×1 IMPLANT
NEEDLE HYPO 21X1.5 SAFETY (NEEDLE) ×1 IMPLANT
NS IRRIG 1000ML POUR BTL (IV SOLUTION) ×1 IMPLANT
PACK BASIC LIMB (CUSTOM PROCEDURE TRAY) ×1 IMPLANT
PAD ARMBOARD 7.5X6 YLW CONV (MISCELLANEOUS) ×1 IMPLANT
PAD CAST 4YDX4 CTTN HI CHSV (CAST SUPPLIES) ×1 IMPLANT
PADDING CAST COTTON 4X4 STRL (CAST SUPPLIES) ×1
PROS LCP PLATE 8H 111M (Plate) ×2 IMPLANT
PROSTHESIS LCP PLATE 8H 111M (Plate) IMPLANT
SCREW LOCK CORT ST 3.5X16 (Screw) IMPLANT
SCREW LOCK CORT ST 3.5X18 (Screw) IMPLANT
SCREW LOCK CORT ST 3.5X20 (Screw) IMPLANT
SCREW LOCK T15 FT 16X3.5X2.9X (Screw) IMPLANT
SCREW LOCKING 3.5X16 (Screw) ×2 IMPLANT
SET BASIN LINEN APH (SET/KITS/TRAYS/PACK) ×1 IMPLANT
SPLINT FIBERGLASS 3X35 (CAST SUPPLIES) IMPLANT
SPLINT J IMMOBILIZER 3X20FT (CAST SUPPLIES) ×1 IMPLANT
STAPLER VISISTAT 35W (STAPLE) ×1 IMPLANT
SUT ETHILON 3 0 FSL (SUTURE) IMPLANT
SUT MON AB 2-0 SH 27 (SUTURE) ×1
SUT MON AB 2-0 SH27 (SUTURE) ×1 IMPLANT
SYR CONTROL 10ML LL (SYRINGE) ×1 IMPLANT
WATER STERILE IRR 1000ML POUR (IV SOLUTION) ×1 IMPLANT

## 2022-04-18 NOTE — Transfer of Care (Signed)
Immediate Anesthesia Transfer of Care Note  Patient: James Reed  Procedure(s) Performed: OPEN REDUCTION INTERNAL FIXATION (ORIF) RADIAL FRACTURE / after removal of previous fracture hardware right wrist (Right: Arm Lower)  Patient Location: PACU  Anesthesia Type:General  Level of Consciousness: awake, alert , oriented, sedated, drowsy and patient cooperative  Airway & Oxygen Therapy: Patient Spontanous Breathing and Patient connected to nasal cannula oxygen  Post-op Assessment: Report given to RN and Post -op Vital signs reviewed and stable  Post vital signs: Reviewed and stable  Last Vitals:  Vitals Value Taken Time  BP 146/97 04/18/22 1645  Temp 98 04/18/22 1644  Pulse 94 04/18/22 1647  Resp 19 04/18/22 1647  SpO2 93 % 04/18/22 1647  Vitals shown include unvalidated device data.  Last Pain:  Vitals:   04/18/22 1225  TempSrc: Oral  PainSc: 8          Complications: No notable events documented.

## 2022-04-18 NOTE — Op Note (Signed)
04/18/2022  4:32 PM  PATIENT:  James Reed  36 y.o. male  PRE-OPERATIVE DIAGNOSIS:  fracture right forearm  POST-OPERATIVE DIAGNOSIS:  fracture right forearm  PROCEDURE:  Procedure(s): OPEN REDUCTION INTERNAL FIXATION (ORIF) RADIAL FRACTURE / after removal of previous fracture hardware right wrist (Right)  Findings the previous 6-hole plate was completely intact he did have some bony overgrowth which had to be removed with a rondure and plate removal required use of an osteotome  There is shows an oblique fracture of the radial shaft approximately midshaft  Implants: synthes 8 hole LC-DCP plate with 7 screws, 5 non locking and 2 locking    Procedures performed as follows.  3 check the patient in the preop area cleared him for surgery signed his right forearm and updated his chart  I checked for implants which were intact previous op note reviewed  Patient was taken the operating for general anesthesia with LMA technique.  He was placed in the supine position at LMA anesthetic.  His right arm was prepped and draped sterilely  We performed timeout to confirm instruments antibiotics and the correct surgical site and patient  Once this was done the limb was exsanguinated with a 4 inch Esmarch the tourniquet was elevated to 250 mmHg.  The patient had a previous volar approach of Henry incision which was reentered and carried proximally between the brachial radialis and proximal forearm musculature.  Subcutaneous tissue was divided and careful dissection was performed until the underlying forearm musculature was encountered.  We tried to go through the same tissue planes as previously noted identifying the radial artery and brachial radialis.  Blunt dissection was carried out and the fracture was identified subperiosteal dissection was performed.  This was carried out proximally and distally and the plate was encountered the screws were removed.  An osteotome was used to elevate the  plate along with the use of a rondure to clear any residual bony overgrowth  Once this was done the fracture site was irrigated and debrided with a rondure and then the fracture was reduced manually held with a clamp.  A plate was contoured and placed on the volar aspect of the plate however this displaced the fracture so we used a flat plate instead.  We placed a 3.5 cortical screw nonlocking on the part of the bone that was the oblique fracture away from the plate  We then used a 3.5 cortical screw to obtain compression  We continued this starting distally and then when we went proximally because the plate was straight the screw brought the plate down to the bone and displaced the fracture  We therefore used locking screws proximally and nonlocking screws distally.  We placed 1 screw interfrag through the plate slightly oblique to capture the fracture itself.  X-rays confirm fracture reduction  Wound was irrigated and closed with 2-0 Monocryl suture and staples we injected 20 cc of half percent plain Marcaine with epinephrine in the subcu tissue  A sterile dressing was applied with a sugar-tong splint  Tourniquet was released prior to placing the splint  Patient was extubated taken recovery in stable condition  Postop plan the patient will be in a splint for 2 weeks then a cast long-arm for 2 weeks and then short arm cast for additional 4 weeks for total of 8 weeks of immobilization   SURGEON:  Surgeon(s) and Role:    Vickki Hearing, MD - Primary  PHYSICIAN ASSISTANT:   ASSISTANTS: sunny smith  ANESTHESIA:   general  EBL:  5 mL   BLOOD ADMINISTERED:none  DRAINS: none   LOCAL MEDICATIONS USED:  MARCAINE     SPECIMEN:  No Specimen  DISPOSITION OF SPECIMEN:  N/A  COUNTS:  YES  TOURNIQUET: pressure 250 mm   Total Tourniquet Time Documented: Upper Arm (Right) - 84 minutes Total: Upper Arm (Right) - 84 minutes   DICTATION: .Viviann Spare Dictation  PLAN OF CARE:  Discharge to home after PACU  PATIENT DISPOSITION:  PACU - hemodynamically stable.   Delay start of Pharmacological VTE agent (>24hrs) due to surgical blood loss or risk of bleeding: not applicable

## 2022-04-18 NOTE — Anesthesia Procedure Notes (Signed)
Procedure Name: LMA Insertion Date/Time: 04/18/2022 2:41 PM  Performed by: Karna Dupes, CRNAPre-anesthesia Checklist: Emergency Drugs available, Patient identified, Suction available and Patient being monitored Patient Re-evaluated:Patient Re-evaluated prior to induction Oxygen Delivery Method: Circle system utilized Preoxygenation: Pre-oxygenation with 100% oxygen Induction Type: IV induction LMA: LMA inserted LMA Size: 4.0 Number of attempts: 1 Placement Confirmation: positive ETCO2 and breath sounds checked- equal and bilateral Tube secured with: Tape Dental Injury: Teeth and Oropharynx as per pre-operative assessment

## 2022-04-18 NOTE — Interval H&P Note (Signed)
History and Physical Interval Note:  04/18/2022 2:19 PM  James Reed  has presented today for surgery, with the diagnosis of fracture right forearm.  The various methods of treatment have been discussed with the patient and family. After consideration of risks, benefits and other options for treatment, the patient has consented to  Procedure(s): OPEN REDUCTION INTERNAL FIXATION (ORIF) RADIAL FRACTURE / after removal of previous fracture hardware right wrist (Right) as a surgical intervention.  The patient's history has been reviewed, patient examined, no change in status, stable for surgery.  I have reviewed the patient's chart and labs.  Questions were answered to the patient's satisfaction.     Arther Abbott

## 2022-04-18 NOTE — Anesthesia Postprocedure Evaluation (Signed)
Anesthesia Post Note  Patient: James Reed  Procedure(s) Performed: OPEN REDUCTION INTERNAL FIXATION (ORIF) RADIAL FRACTURE / after removal of previous fracture hardware right wrist (Right: Arm Lower)  Patient location during evaluation: PACU Anesthesia Type: General Level of consciousness: awake and alert and oriented Pain management: satisfactory to patient Vital Signs Assessment: post-procedure vital signs reviewed and stable Respiratory status: spontaneous breathing, nonlabored ventilation and respiratory function stable Cardiovascular status: blood pressure returned to baseline and stable Postop Assessment: no apparent nausea or vomiting Anesthetic complications: no   No notable events documented.   Last Vitals:  Vitals:   04/18/22 1740 04/18/22 1744  BP:  (!) 158/103  Pulse:  95  Resp:  17  Temp:  36.7 C  SpO2: 96% 95%    Last Pain:  Vitals:   04/18/22 1744  TempSrc: Oral  PainSc: 5                  Raesha Coonrod C Syniyah Bourne

## 2022-04-18 NOTE — Brief Op Note (Signed)
04/18/2022  4:32 PM  PATIENT:  James Reed  36 y.o. male  PRE-OPERATIVE DIAGNOSIS:  fracture right forearm  POST-OPERATIVE DIAGNOSIS:  fracture right forearm  PROCEDURE:  Procedure(s): OPEN REDUCTION INTERNAL FIXATION (ORIF) RADIAL FRACTURE / after removal of previous fracture hardware right wrist (Right)   Implants: synthes 8 hole LC-DCP plate with 7 screws, 5 non locking and 2 locking    SURGEON:  Surgeon(s) and Role:    Carole Civil, MD - Primary  PHYSICIAN ASSISTANT:   ASSISTANTS: sunny smith    ANESTHESIA:   general  EBL:  5 mL   BLOOD ADMINISTERED:none  DRAINS: none   LOCAL MEDICATIONS USED:  MARCAINE     SPECIMEN:  No Specimen  DISPOSITION OF SPECIMEN:  N/A  COUNTS:  YES  TOURNIQUET: pressure 250 mm   Total Tourniquet Time Documented: Upper Arm (Right) - 84 minutes Total: Upper Arm (Right) - 84 minutes   DICTATION: .Viviann Spare Dictation  PLAN OF CARE: Discharge to home after PACU  PATIENT DISPOSITION:  PACU - hemodynamically stable.   Delay start of Pharmacological VTE agent (>24hrs) due to surgical blood loss or risk of bleeding: not applicable
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
## 2022-04-19 ENCOUNTER — Encounter: Payer: Self-pay | Admitting: Orthopedic Surgery

## 2022-04-19 ENCOUNTER — Ambulatory Visit (INDEPENDENT_AMBULATORY_CARE_PROVIDER_SITE_OTHER): Payer: Self-pay | Admitting: Orthopedic Surgery

## 2022-04-19 ENCOUNTER — Telehealth: Payer: Self-pay | Admitting: Radiology

## 2022-04-19 ENCOUNTER — Ambulatory Visit (INDEPENDENT_AMBULATORY_CARE_PROVIDER_SITE_OTHER): Payer: Self-pay

## 2022-04-19 DIAGNOSIS — S52371D Galeazzi's fracture of right radius, subsequent encounter for closed fracture with routine healing: Secondary | ICD-10-CM

## 2022-04-19 DIAGNOSIS — S52331D Displaced oblique fracture of shaft of right radius, subsequent encounter for closed fracture with routine healing: Secondary | ICD-10-CM

## 2022-04-19 MED ORDER — IBUPROFEN 800 MG PO TABS: 800.0000 mg | ORAL_TABLET | Freq: Three times a day (TID) | ORAL | 1 refills | Status: DC | PRN

## 2022-04-19 MED ORDER — OXYCODONE HCL 5 MG PO TABS: ORAL_TABLET | ORAL | 0 refills | Status: DC

## 2022-04-19 NOTE — Progress Notes (Signed)
Chief Complaint  Patient presents with   Post-op Follow-up    Right forearm/ ace bandages changed, appeared a bit tight last pain meds at 8:30 has severe pain not taking Ibuprofen, pain meds only / needs ibuprofen sent if he is to take it.    Postop day 1 status post open reduction internal fixation right radial shaft fracture with removal of prior hardware  Patient called in with severe pain right forearm.  All compressive bandages were removed, compartments are soft.  Neurovascular exam was intact.  X-rays show no change in position of the fracture or hardware  New dressings and sugar-tong splint applied  Medication changed  Encouraged to ice and elevate the arm  Meds ordered this encounter  Medications   DISCONTD: oxyCODONE (OXY IR/ROXICODONE) 5 MG immediate release tablet    Sig: 15 mg q4 prn pain over 6/10    Dispense:  30 tablet    Refill:  0    Ok to fill he was re evaluated after surgery yesterday and needs more medication   ibuprofen (ADVIL) 800 MG tablet    Sig: Take 1 tablet (800 mg total) by mouth every 8 (eight) hours as needed.    Dispense:  90 tablet    Refill:  1   oxyCODONE (OXY IR/ROXICODONE) 5 MG immediate release tablet    Sig: 15 mg q4 prn pain over 6/10    Dispense:  30 tablet    Refill:  0    Ok to fill he was re evaluated after surgery yesterday and needs more medication

## 2022-04-19 NOTE — Telephone Encounter (Signed)
Patient called, he is having a lot of pain, the oxycodone 5mg  is not touching it.  He was taking 1 po q 4 hr and last night tried taking 2 and still no relief.  Please call him.   Wife cannot get him here Friday morning for an appt.  Cannot get him here til 4pm Friday.  I made an appt Monday at 830 am 04/24/22.  Please change appt if needed?  Thanks.

## 2022-04-19 NOTE — Telephone Encounter (Signed)
Ice elevate move fingers needs to come in to check make sure splint not too tight he is coming in now will be here about 1030.

## 2022-04-19 NOTE — Telephone Encounter (Signed)
Ok   Can we add him to the schedule

## 2022-04-19 NOTE — Patient Instructions (Signed)
Take 500 mg tylenol every 6 hours   Take 800 mg of ibuproefen every 8 hours   Take 5-15 mg of oxycodone every 4 hours

## 2022-04-24 ENCOUNTER — Ambulatory Visit (INDEPENDENT_AMBULATORY_CARE_PROVIDER_SITE_OTHER): Payer: Self-pay | Admitting: Orthopedic Surgery

## 2022-04-24 ENCOUNTER — Encounter: Payer: Self-pay | Admitting: Orthopedic Surgery

## 2022-04-24 DIAGNOSIS — S52331D Displaced oblique fracture of shaft of right radius, subsequent encounter for closed fracture with routine healing: Secondary | ICD-10-CM

## 2022-04-24 MED ORDER — OXYCODONE HCL 5 MG PO TABS: ORAL_TABLET | ORAL | 0 refills | Status: DC

## 2022-04-24 NOTE — Progress Notes (Signed)
Chief Complaint  Patient presents with   Post-op Follow-up    Right forearm DOS 04/18/22   Medication Refill    Oxycodone 5mg  Doxycycline 100 mg   Postop visit status post ORIF and hardware removal refracture right forearm  The patient says he is much improved with increased pain medication and rewrapping of his splint  He can move his fingers all of them there is no numbness or tingling  He would like his pain medicine refilled  We can start opioid taper after his next visit at which time he will have  Staple removal  X-ray  Application long-arm cast  Meds ordered this encounter  Medications   oxyCODONE (OXY IR/ROXICODONE) 5 MG immediate release tablet    Sig: 15 mg q4 prn pain over 6/10    Dispense:  30 tablet    Refill:  0    Ok to fill he was re evaluated after surgery yesterday and needs more medication

## 2022-05-03 ENCOUNTER — Encounter: Payer: Self-pay | Admitting: Orthopedic Surgery

## 2022-05-03 ENCOUNTER — Ambulatory Visit (INDEPENDENT_AMBULATORY_CARE_PROVIDER_SITE_OTHER): Payer: Self-pay | Admitting: Orthopedic Surgery

## 2022-05-03 ENCOUNTER — Ambulatory Visit (INDEPENDENT_AMBULATORY_CARE_PROVIDER_SITE_OTHER): Payer: Self-pay

## 2022-05-03 DIAGNOSIS — S52371D Galeazzi's fracture of right radius, subsequent encounter for closed fracture with routine healing: Secondary | ICD-10-CM

## 2022-05-03 DIAGNOSIS — S52331D Displaced oblique fracture of shaft of right radius, subsequent encounter for closed fracture with routine healing: Secondary | ICD-10-CM

## 2022-05-03 MED ORDER — OXYCODONE HCL 5 MG PO TABS: ORAL_TABLET | ORAL | 0 refills | Status: DC

## 2022-05-03 NOTE — Progress Notes (Signed)
Chief Complaint  Patient presents with   Post-op Follow-up    Right forearm fracture ORIF 04/18/22   36 year old male status post ORIF right forearm with hardware removal secondary to refracture of a previously placed hardware right forearm  Date of surgery April 18, 2022  Implants Synthes plate  Patient was on 15 mg of oxycodone to control pain along with ibuprofen his pain is now getting better controlled and we will switch him to 10 mg  Staples are out wound checked okay neurovascular exam is intact fingers are moving well  X-ray shows fracture is reduced well hardware is intact  Patient placed in long-arm cast  Plan cast off in 2 weeks for x-ray then placed on or in short arm cast for additional 4 weeks  Decrease oxycodone to 10 mg every 6  Meds ordered this encounter  Medications   oxyCODONE (OXY IR/ROXICODONE) 5 MG immediate release tablet    Sig: 10  mg q4 prn pain over 6/10    Dispense:  30 tablet    Refill:  0    Ok to fill he was re evaluated after surgery yesterday and needs more medication

## 2022-05-16 NOTE — Progress Notes (Unsigned)
Postop visit status post hardware removal for refracture right forearm with open reduction internal fixation  Surgery date April 18, 2022  This is the 4-week follow-up visit  We are tapering his medication he is currently on oxycodone 10 mg every 6 hours  He is here today for cast off x-ray and hopefully convert to a short arm cast

## 2022-05-17 ENCOUNTER — Ambulatory Visit (INDEPENDENT_AMBULATORY_CARE_PROVIDER_SITE_OTHER): Payer: Self-pay | Admitting: Orthopedic Surgery

## 2022-05-17 ENCOUNTER — Ambulatory Visit (INDEPENDENT_AMBULATORY_CARE_PROVIDER_SITE_OTHER): Payer: Self-pay

## 2022-05-17 DIAGNOSIS — S52371D Galeazzi's fracture of right radius, subsequent encounter for closed fracture with routine healing: Secondary | ICD-10-CM

## 2022-06-14 ENCOUNTER — Encounter: Payer: Self-pay | Admitting: Orthopedic Surgery

## 2022-09-14 ENCOUNTER — Encounter: Payer: Self-pay | Admitting: Radiology

## 2022-10-08 ENCOUNTER — Encounter (HOSPITAL_COMMUNITY): Payer: Self-pay

## 2022-10-08 ENCOUNTER — Other Ambulatory Visit: Payer: Self-pay

## 2022-10-08 ENCOUNTER — Emergency Department (HOSPITAL_COMMUNITY): Payer: 59

## 2022-10-08 ENCOUNTER — Emergency Department (HOSPITAL_COMMUNITY)
Admission: EM | Admit: 2022-10-08 | Discharge: 2022-10-08 | Disposition: A | Discharge status: Home / Self Care | Payer: 59 | Attending: Emergency Medicine | Admitting: Emergency Medicine

## 2022-10-08 DIAGNOSIS — R930 Abnormal findings on diagnostic imaging of skull and head, not elsewhere classified: Secondary | ICD-10-CM | POA: Insufficient documentation

## 2022-10-08 DIAGNOSIS — S59911A Unspecified injury of right forearm, initial encounter: Secondary | ICD-10-CM | POA: Diagnosis not present

## 2022-10-08 DIAGNOSIS — Y9355 Activity, bike riding: Secondary | ICD-10-CM | POA: Diagnosis not present

## 2022-10-08 DIAGNOSIS — S52531A Colles' fracture of right radius, initial encounter for closed fracture: Secondary | ICD-10-CM | POA: Insufficient documentation

## 2022-10-08 DIAGNOSIS — S52511A Displaced fracture of right radial styloid process, initial encounter for closed fracture: Secondary | ICD-10-CM | POA: Diagnosis not present

## 2022-10-08 DIAGNOSIS — S0990XA Unspecified injury of head, initial encounter: Secondary | ICD-10-CM | POA: Diagnosis not present

## 2022-10-08 MED ORDER — KETOROLAC TROMETHAMINE 60 MG/2ML IM SOLN
30.0000 mg | Freq: Once | INTRAMUSCULAR | Status: AC
  Administered 2022-10-08: 30 mg via INTRAMUSCULAR
  Filled 2022-10-08: qty 2

## 2022-10-08 MED ORDER — KETOROLAC TROMETHAMINE 10 MG PO TABS: 10.0000 mg | ORAL_TABLET | Freq: Four times a day (QID) | ORAL | 0 refills | Status: DC | PRN
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
## 2022-10-08 NOTE — Discharge Instructions (Addendum)
You have been seen today for your complaint of right wrist pain. Your imaging showed a fracture of the forearm. Your discharge medications include Toradol.  This is a pain medicine.  Take it as needed for pain. Follow up with: Dr. Aline Brochure, your orthopedic surgeon Please seek immediate medical care if you develop any of the following symptoms: Your hand or fingers become numb or turn cold, blue, or gray. You have tingling or numbness in your fingers, even after you loosen your splint (if this applies). At this time there does not appear to be the presence of an emergent medical condition, however there is always the potential for conditions to change. Please read and follow the below instructions.  Do not take your medicine if  develop an itchy rash, swelling in your mouth or lips, or difficulty breathing; call 911 and seek immediate emergency medical attention if this occurs.  You may review your lab tests and imaging results in their entirety on your MyChart account.  Please discuss all results of fully with your primary care provider and other specialist at your follow-up visit.  Note: Portions of this text may have been transcribed using voice recognition software. Every effort was made to ensure accuracy; however, inadvertent computerized transcription errors may still be present.
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
## 2022-10-09 ENCOUNTER — Telehealth: Payer: Self-pay | Admitting: Orthopedic Surgery

## 2022-10-09 NOTE — Telephone Encounter (Signed)
Returned the patient's call, lvm for him to call me back

## 2022-10-11 ENCOUNTER — Encounter: Payer: Self-pay | Admitting: Orthopedic Surgery

## 2022-10-11 ENCOUNTER — Ambulatory Visit: Payer: 59 | Admitting: Orthopedic Surgery

## 2022-10-11 DIAGNOSIS — S52371A Galeazzi's fracture of right radius, initial encounter for closed fracture: Secondary | ICD-10-CM | POA: Diagnosis not present
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
## 2022-10-11 NOTE — Patient Instructions (Signed)
Your surgery will be at [facility] by Dr Harrison  The hospital will contact you with a preoperative appointment to discuss Anesthesia.  Please arrive on time or 15 minutes early for the preoperative appointment, they have a very tight schedule if you are late or do not come in your surgery will be cancelled.  The phone number is [phone]. Please bring your medications with you for the appointment. They will tell you the arrival time and medication instructions when you have your preoperative evaluation. Do not wear nail polish the day of your surgery and if you take Phentermine you need to stop this medication ONE WEEK prior to your surgery. If you take Invokana, Farxiga, Jardiance, or Steglatro) - Hold 72 hours before the procedure.  If you take Ozempic,  Bydureon or Trulicity do not take for 8 days before your surgery. If you take Victoza, Rybelsis, Saxenda or Adlyxi stop 24 hours before the procedure.  Please arrive at the hospital 2 hours before procedure if scheduled at 9:30 or later in the day or at the time the nurse tells you at your preoperative visit.   If you have my chart do not use the time given in my chart use the time given to you by the nurse during your preoperative visit.   Your surgery  time may change. Please be available for phone calls the day of your surgery and the day before. The Short Stay department may need to discuss changes about your surgery time. Not reaching the you could lead to procedure delays and possible cancellation.  You must have a ride home and someone to stay with you for 24 to 48 hours. The person taking you home will receive and sign for the your discharge instructions.  Please be prepared to give your support person's name and telephone number to [facility]. Dr Harrison will need that name and phone number post procedure.   

## 2022-10-11 NOTE — Patient Instructions (Signed)
James Reed  10/11/2022     @PREFPERIOPPHARMACY @   Your procedure is scheduled on  10/17/2022.   Report to [facility] at  1030  A.M.   Call this number if you have problems the morning of surgery:  [phone]  If you experience any cold or flu symptoms such as cough, fever, chills, shortness of breath, etc. between now and your scheduled surgery, please notify us at the above number.   Remember:  Do not eat or drink after midnight.      Take these medicines the morning of surgery with A SIP OF WATER                       toradol or oxycodone (if needed).     Do not wear jewelry, make-up or nail polish.  Do not wear lotions, powders, or perfumes, or deodorant.  Do not shave 48 hours prior to surgery.  Men may shave face and neck.  Do not bring valuables to the hospital.  [facility] is not responsible for any belongings or valuables.  Contacts, dentures or bridgework may not be worn into surgery.  Leave your suitcase in the car.  After surgery it may be brought to your room.  For patients admitted to the hospital, discharge time will be determined by your treatment team.  Patients discharged the day of surgery will not be allowed to drive home and must have someone with them for 24 hours.    Special instructions:   DO NOT smoke tobacco or vape for 24 hours before your procedure.  Please read over the following fact sheets that you were given. Pain Booklet, Coughing and Deep Breathing, Surgical Site Infection Prevention, Anesthesia Post-op Instructions, and Care and Recovery After Surgery      Wrist Fracture Treated With ORIF, Care After This sheet gives you information about how to care for yourself after your procedure. Your health care provider may also give you more specific instructions. If you have problems or questions, contact your health care provider. What can I expect after the procedure? After the procedure, it is common to  have: Pain. Swelling. Stiffness. A small amount of drainage from the incision. Follow these instructions at home: If you have a cast: Do not stick anything inside the cast to scratch your skin. Doing that increases your risk of infection. Check the skin around the cast every day. Tell your health care provider about any concerns. You may put lotion on dry skin around the edges of the cast. Do not put lotion on the skin underneath the cast. Keep the cast clean and dry. If you have a splint or sling: Wear the splint or sling as told by your health care provider. Remove it only as told by your health care provider. Loosen the splint or sling if your fingers tingle, become numb, or turn cold and blue. Keep the splint or sling clean and dry. Bathing Do not take baths, swim, or use a hot tub until your health care provider approves. Ask your health care provider if you may take showers. You may only be allowed to take sponge baths. If your cast, splint, or sling is not waterproof: Do not let it get wet. Cover it with a watertight covering when you take a bath or shower. If you have a sling, remove it for bathing only if your health care provider tells you it is safe to do  that. Keep the bandage (dressing) dry until your health care provider says it can be removed. Incision care  Follow instructions from your health care provider about how to take care of your incision. Make sure you: Wash your hands with soap and water for at least 20 seconds before and after you change your dressing. If soap and water are not available, use hand sanitizer. Change your dressing as told by your health care provider. Leave stitches (sutures), skin glue, or adhesive strips in place. These skin closures may need to stay in place for 2 weeks or longer. If adhesive strip edges start to loosen and curl up, you may trim the loose edges. Do not remove adhesive strips completely unless your health care provider tells you  to do that. Check your incision area every day for signs of infection. Check for: Redness. More swelling or pain. Blood or more fluid. Warmth. Pus or a bad smell. Managing pain, stiffness, and swelling  If directed, put ice on the injured area. To do this: If you have a removable splint or sling, remove it as told by your health care provider. Put ice in a plastic bag. Place a towel between your skin and the bag or between your cast and the bag. Leave the ice on for 20 minutes, 2-3 times a day. Remove the ice if your skin turns bright red. This is very important. If you cannot feel pain, heat, or cold, you have a greater risk of damage to the area. Move your fingers often to reduce stiffness and swelling. Raise (elevate) the injured area above the level of your heart while you are sitting or lying down. Driving If you were given a sedative during the procedure, it can affect you for several hours. Do not drive or operate machinery until your health care provider says that it is safe. Ask your health care provider when it is safe to drive if you have a cast, splint, or sling on your wrist. Activity Return to your normal activities as told by your health care provider. Ask your health care provider what activities are safe for you. Do exercises as told by your health care provider. Do not lift with or put weight on your injured wrist until your health care provider approves. Avoid pulling and pushing. Medicines Take over-the-counter and prescription medicines only as told by your health care provider. Ask your health care provider if the medicine prescribed to you: Requires you to avoid driving or using machinery. Can cause constipation. You may need to take these actions to prevent or treat constipation: Drink enough fluid to keep your urine pale yellow. Take over-the-counter or prescription medicines. Eat foods that are high in fiber, such as beans, whole grains, and fresh fruits and  vegetables. Limit foods that are high in fat and processed sugars, such as fried or sweet foods. General instructions Do not put pressure on any part of the cast or splint until it is fully hardened. This may take several hours. Do not use any products that contain nicotine or tobacco, such as cigarettes, e-cigarettes, and chewing tobacco. These can delay bone healing after surgery. If you need help quitting, ask your health care provider. Keep all follow-up visits. This is important. Contact a health care provider if: Your cast, splint, or sling is damaged or loose. Your pain is not controlled with medicine. You have any of these signs of infection: A fever. Redness around your incision. More swelling or pain around your incision. Blood or  more fluid coming from your incision. Warmth coming from your incision. Pus or a bad smell coming from your incision or your dressing. You develop a rash. Get help right away if: Your skin or fingers on your injured arm turn blue or gray. Your arm feels cold or numb. You have severe pain in your injured wrist. You have trouble breathing. You feel faint or light-headed. Summary After the procedure, it is common to have pain, swelling, stiffness, and a small amount of drainage from the incision. You may use ice, elevation, and pain medicine as told by your health care provider to reduce pain and swelling. Wear your splint or sling as told by your health care provider. Do not lift with or put weight on your injured wrist until your health care provider approves. This information is not intended to replace advice given to you by your health care provider. Make sure you discuss any questions you have with your health care provider. Document Revised: 10/14/2019 Document Reviewed: 10/14/2019 Elsevier Patient Education  [place] Anesthesia, Adult, Care After The following information offers guidance on how to care for yourself after your  procedure. Your health care provider may also give you more specific instructions. If you have problems or questions, contact your health care provider. What can I expect after the procedure? After the procedure, it is common for people to: Have pain or discomfort at the IV site. Have nausea or vomiting. Have a sore throat or hoarseness. Have trouble concentrating. Feel cold or chills. Feel weak, sleepy, or tired (fatigue). Have soreness and body aches. These can affect parts of the body that were not involved in surgery. Follow these instructions at home: For the time period you were told by your health care provider:  Rest. Do not participate in activities where you could fall or become injured. Do not drive or use machinery. Do not drink alcohol. Do not take sleeping pills or medicines that cause drowsiness. Do not make important decisions or sign legal documents. Do not take care of children on your own. General instructions Drink enough fluid to keep your urine pale yellow. If you have sleep apnea, surgery and certain medicines can increase your risk for breathing problems. Follow instructions from your health care provider about wearing your sleep device: Anytime you are sleeping, including during daytime naps. While taking prescription pain medicines, sleeping medicines, or medicines that make you drowsy. Return to your normal activities as told by your health care provider. Ask your health care provider what activities are safe for you. Take over-the-counter and prescription medicines only as told by your health care provider. Do not use any products that contain nicotine or tobacco. These products include cigarettes, chewing tobacco, and vaping devices, such as e-cigarettes. These can delay incision healing after surgery. If you need help quitting, ask your health care provider. Contact a health care provider if: You have nausea or vomiting that does not get better with  medicine. You vomit every time you eat or drink. You have pain that does not get better with medicine. You cannot urinate or have bloody urine. You develop a skin rash. You have a fever. Get help right away if: You have trouble breathing. You have chest pain. You vomit blood. These symptoms may be an emergency. Get help right away. Call 911. Do not wait to see if the symptoms will go away. Do not drive yourself to the hospital. Summary After the procedure, it is common to have a sore  throat, hoarseness, nausea, vomiting, or to feel weak, sleepy, or fatigue. For the time period you were told by your health care provider, do not drive or use machinery. Get help right away if you have difficulty breathing, have chest pain, or vomit blood. These symptoms may be an emergency. This information is not intended to replace advice given to you by your health care provider. Make sure you discuss any questions you have with your health care provider. Document Revised: 09/30/2021 Document Reviewed: 09/30/2021 Elsevier Patient Education  [place]. How to Use Chlorhexidine Before Surgery Chlorhexidine gluconate (CHG) is a germ-killing (antiseptic) solution that is used to clean the skin. It can get rid of the bacteria that normally live on the skin and can keep them away for about 24 hours. To clean your skin with CHG, you may be given: A CHG solution to use in the shower or as part of a sponge bath. A prepackaged cloth that contains CHG. Cleaning your skin with CHG may help lower the risk for infection: While you are staying in the intensive care unit of the hospital. If you have a vascular access, such as a central line, to provide short-term or long-term access to your veins. If you have a catheter to drain urine from your bladder. If you are on a ventilator. A ventilator is a machine that helps you breathe by moving air in and out of your lungs. After surgery. What are the risks? Risks  of using CHG include: A skin reaction. Hearing loss, if CHG gets in your ears and you have a perforated eardrum. Eye injury, if CHG gets in your eyes and is not rinsed out. The CHG product catching fire. Make sure that you avoid smoking and flames after applying CHG to your skin. Do not use CHG: If you have a chlorhexidine allergy or have previously reacted to chlorhexidine. On babies younger than 34 months of age. How to use CHG solution Use CHG only as told by your health care provider, and follow the instructions on the label. Use the full amount of CHG as directed. Usually, this is one bottle. During a shower Follow these steps when using CHG solution during a shower (unless your health care provider gives you different instructions): Start the shower. Use your normal soap and shampoo to wash your face and hair. Turn off the shower or move out of the shower stream. Pour the CHG onto a clean washcloth. Do not use any type of brush or rough-edged sponge. Starting at your neck, lather your body down to your toes. Make sure you follow these instructions: If you will be having surgery, pay special attention to the part of your body where you will be having surgery. Scrub this area for at least 1 minute. Do not use CHG on your head or face. If the solution gets into your ears or eyes, rinse them well with water. Avoid your genital area. Avoid any areas of skin that have broken skin, cuts, or scrapes. Scrub your back and under your arms. Make sure to wash skin folds. Let the lather sit on your skin for 1-2 minutes or as long as told by your health care provider. Thoroughly rinse your entire body in the shower. Make sure that all body creases and crevices are rinsed well. Dry off with a clean towel. Do not put any substances on your body afterward--such as powder, lotion, or perfume--unless you are told to do so by your health care provider. Only use  lotions that are recommended by the  manufacturer. Put on clean clothes or pajamas. If it is the night before your surgery, sleep in clean sheets.  During a sponge bath Follow these steps when using CHG solution during a sponge bath (unless your health care provider gives you different instructions): Use your normal soap and shampoo to wash your face and hair. Pour the CHG onto a clean washcloth. Starting at your neck, lather your body down to your toes. Make sure you follow these instructions: If you will be having surgery, pay special attention to the part of your body where you will be having surgery. Scrub this area for at least 1 minute. Do not use CHG on your head or face. If the solution gets into your ears or eyes, rinse them well with water. Avoid your genital area. Avoid any areas of skin that have broken skin, cuts, or scrapes. Scrub your back and under your arms. Make sure to wash skin folds. Let the lather sit on your skin for 1-2 minutes or as long as told by your health care provider. Using a different clean, wet washcloth, thoroughly rinse your entire body. Make sure that all body creases and crevices are rinsed well. Dry off with a clean towel. Do not put any substances on your body afterward--such as powder, lotion, or perfume--unless you are told to do so by your health care provider. Only use lotions that are recommended by the manufacturer. Put on clean clothes or pajamas. If it is the night before your surgery, sleep in clean sheets. How to use CHG prepackaged cloths Only use CHG cloths as told by your health care provider, and follow the instructions on the label. Use the CHG cloth on clean, dry skin. Do not use the CHG cloth on your head or face unless your health care provider tells you to. When washing with the CHG cloth: Avoid your genital area. Avoid any areas of skin that have broken skin, cuts, or scrapes. Before surgery Follow these steps when using a CHG cloth to clean before surgery (unless  your health care provider gives you different instructions): Using the CHG cloth, vigorously scrub the part of your body where you will be having surgery. Scrub using a back-and-forth motion for 3 minutes. The area on your body should be completely wet with CHG when you are done scrubbing. Do not rinse. Discard the cloth and let the area air-dry. Do not put any substances on the area afterward, such as powder, lotion, or perfume. Put on clean clothes or pajamas. If it is the night before your surgery, sleep in clean sheets.  For general bathing Follow these steps when using CHG cloths for general bathing (unless your health care provider gives you different instructions). Use a separate CHG cloth for each area of your body. Make sure you wash between any folds of skin and between your fingers and toes. Wash your body in the following order, switching to a new cloth after each step: The front of your neck, shoulders, and chest. Both of your arms, under your arms, and your hands. Your stomach and groin area, avoiding the genitals. Your right leg and foot. Your left leg and foot. The back of your neck, your back, and your buttocks. Do not rinse. Discard the cloth and let the area air-dry. Do not put any substances on your body afterward--such as powder, lotion, or perfume--unless you are told to do so by your health care provider. Only use lotions that  are recommended by the manufacturer. Put on clean clothes or pajamas. Contact a health care provider if: Your skin gets irritated after scrubbing. You have questions about using your solution or cloth. You swallow any chlorhexidine. Call your local poison control center (1-(343)590-8962 in the [place] Get help right away if: Your eyes itch badly, or they become very red or swollen. Your skin itches badly and is red or swollen. Your hearing changes. You have trouble seeing. You have swelling or tingling in your mouth or throat. You have trouble  breathing. These symptoms may represent a serious problem that is an emergency. Do not wait to see if the symptoms will go away. Get medical help right away. Call your local emergency services (911 in the [place] Do not drive yourself to the hospital. Summary Chlorhexidine gluconate (CHG) is a germ-killing (antiseptic) solution that is used to clean the skin. Cleaning your skin with CHG may help to lower your risk for infection. You may be given CHG to use for bathing. It may be in a bottle or in a prepackaged cloth to use on your skin. Carefully follow your health care provider's instructions and the instructions on the product label. Do not use CHG if you have a chlorhexidine allergy. Contact your health care provider if your skin gets irritated after scrubbing. This information is not intended to replace advice given to you by your health care provider. Make sure you discuss any questions you have with your health care provider. Document Revised: 10/31/2021 Document Reviewed: 09/13/2020 Elsevier Patient Education  [place].

## 2022-10-16 ENCOUNTER — Encounter (HOSPITAL_COMMUNITY): Payer: Self-pay

## 2022-10-16 ENCOUNTER — Encounter (HOSPITAL_COMMUNITY)
Admission: RE | Admit: 2022-10-16 | Discharge: 2022-10-16 | Disposition: A | Discharge status: Home / Self Care | Payer: 59 | Source: Ambulatory Visit | Attending: Orthopedic Surgery | Admitting: Orthopedic Surgery

## 2022-10-16 DIAGNOSIS — Z01812 Encounter for preprocedural laboratory examination: Secondary | ICD-10-CM | POA: Insufficient documentation

## 2022-10-16 DIAGNOSIS — S52371A Galeazzi's fracture of right radius, initial encounter for closed fracture: Secondary | ICD-10-CM | POA: Diagnosis not present

## 2022-10-16 LAB — CBC WITH DIFFERENTIAL/PLATELET
Abs Immature Granulocytes: 0.02 10*3/uL (ref 0.00–0.07)
Basophils Absolute: 0 10*3/uL (ref 0.0–0.1)
Basophils Relative: 0 %
Eosinophils Absolute: 0 10*3/uL (ref 0.0–0.5)
Eosinophils Relative: 1 %
HCT: 49.6 % (ref 39.0–52.0)
Hemoglobin: 17.6 g/dL — ABNORMAL HIGH (ref 13.0–17.0)
Immature Granulocytes: 0 %
Lymphocytes Relative: 21 %
Lymphs Abs: 1.6 10*3/uL (ref 0.7–4.0)
MCH: 31.8 pg (ref 26.0–34.0)
MCHC: 35.5 g/dL (ref 30.0–36.0)
MCV: 89.5 fL (ref 80.0–100.0)
Monocytes Absolute: 1 10*3/uL (ref 0.1–1.0)
Monocytes Relative: 13 %
Neutro Abs: 4.9 10*3/uL (ref 1.7–7.7)
Neutrophils Relative %: 65 %
Platelets: 208 10*3/uL (ref 150–400)
RBC: 5.54 MIL/uL (ref 4.22–5.81)
RDW: 12.6 % (ref 11.5–15.5)
WBC: 7.5 10*3/uL (ref 4.0–10.5)
nRBC: 0 % (ref 0.0–0.2)

## 2022-10-16 LAB — BASIC METABOLIC PANEL
Anion gap: 9 (ref 5–15)
BUN: 16 mg/dL (ref 6–20)
CO2: 24 mmol/L (ref 22–32)
Calcium: 9.1 mg/dL (ref 8.9–10.3)
Chloride: 104 mmol/L (ref 98–111)
Creatinine, Ser: 0.9 mg/dL (ref 0.61–1.24)
GFR, Estimated: >60 mL/min (ref >60)
Glucose, Bld: 84 mg/dL (ref 70–99)
Potassium: 3.6 mmol/L (ref 3.5–5.1)
Sodium: 137 mmol/L (ref 135–145)
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
## 2022-10-17 ENCOUNTER — Ambulatory Visit (HOSPITAL_COMMUNITY): Payer: 59

## 2022-10-17 ENCOUNTER — Other Ambulatory Visit: Payer: Self-pay

## 2022-10-17 ENCOUNTER — Ambulatory Visit (HOSPITAL_BASED_OUTPATIENT_CLINIC_OR_DEPARTMENT_OTHER): Payer: 59

## 2022-10-17 ENCOUNTER — Ambulatory Visit (HOSPITAL_COMMUNITY)
Admission: RE | Admit: 2022-10-17 | Discharge: 2022-10-17 | Disposition: A | Discharge status: Home / Self Care | Payer: 59 | Attending: Orthopedic Surgery | Admitting: Orthopedic Surgery

## 2022-10-17 ENCOUNTER — Emergency Department (HOSPITAL_COMMUNITY)
Admission: EM | Admit: 2022-10-17 | Discharge: 2022-10-17 | Disposition: A | Discharge status: Home / Self Care | Payer: 59 | Source: Home / Self Care | Attending: Emergency Medicine | Admitting: Emergency Medicine

## 2022-10-17 ENCOUNTER — Encounter (HOSPITAL_COMMUNITY): Payer: Self-pay | Admitting: Orthopedic Surgery

## 2022-10-17 ENCOUNTER — Encounter (HOSPITAL_COMMUNITY): Payer: Self-pay

## 2022-10-17 ENCOUNTER — Encounter (HOSPITAL_COMMUNITY)
Admission: RE | Disposition: A | Discharge status: Home / Self Care | Payer: Self-pay | Source: Home / Self Care | Attending: Orthopedic Surgery

## 2022-10-17 DIAGNOSIS — S52371A Galeazzi's fracture of right radius, initial encounter for closed fracture: Secondary | ICD-10-CM | POA: Diagnosis not present

## 2022-10-17 DIAGNOSIS — S62101A Fracture of unspecified carpal bone, right wrist, initial encounter for closed fracture: Secondary | ICD-10-CM

## 2022-10-17 DIAGNOSIS — G9782 Other postprocedural complications and disorders of nervous system: Secondary | ICD-10-CM

## 2022-10-17 DIAGNOSIS — M9689 Other intraoperative and postprocedural complications and disorders of the musculoskeletal system: Secondary | ICD-10-CM | POA: Diagnosis not present

## 2022-10-17 DIAGNOSIS — G8918 Other acute postprocedural pain: Secondary | ICD-10-CM | POA: Insufficient documentation

## 2022-10-17 DIAGNOSIS — R200 Anesthesia of skin: Secondary | ICD-10-CM | POA: Insufficient documentation

## 2022-10-17 DIAGNOSIS — Y838 Other surgical procedures as the cause of abnormal reaction of the patient, or of later complication, without mention of misadventure at the time of the procedure: Secondary | ICD-10-CM | POA: Insufficient documentation

## 2022-10-17 DIAGNOSIS — R202 Paresthesia of skin: Secondary | ICD-10-CM | POA: Insufficient documentation

## 2022-10-17 HISTORY — PX: ORIF WRIST FRACTURE: SHX2133

## 2022-10-17 SURGERY — OPEN REDUCTION INTERNAL FIXATION (ORIF) WRIST FRACTURE
Anesthesia: General | Site: Wrist | Laterality: Right

## 2022-10-17 MED ORDER — FENTANYL CITRATE (PF) 100 MCG/2ML IJ SOLN
INTRAMUSCULAR | Status: AC
  Filled 2022-10-17: qty 2

## 2022-10-17 MED ORDER — ONDANSETRON HCL 4 MG/2ML IJ SOLN
INTRAMUSCULAR | Status: AC
  Filled 2022-10-17: qty 2

## 2022-10-17 MED ORDER — DEXMEDETOMIDINE HCL IN NACL 80 MCG/20ML IV SOLN
INTRAVENOUS | Status: DC | PRN
  Administered 2022-10-17: 4 ug via BUCCAL
  Administered 2022-10-17 (×2): 8 ug via BUCCAL

## 2022-10-17 MED ORDER — DEXMEDETOMIDINE HCL IN NACL 80 MCG/20ML IV SOLN
INTRAVENOUS | Status: AC
  Filled 2022-10-17: qty 20

## 2022-10-17 MED ORDER — OXYCODONE HCL 5 MG PO TABS: ORAL_TABLET | ORAL | 0 refills | Status: AC

## 2022-10-17 MED ORDER — OXYCODONE HCL 5 MG PO TABS
10.0000 mg | ORAL_TABLET | Freq: Once | ORAL | Status: AC
  Administered 2022-10-17: 10 mg via ORAL
  Filled 2022-10-17: qty 2

## 2022-10-17 MED ORDER — CEFAZOLIN SODIUM-DEXTROSE 2-4 GM/100ML-% IV SOLN
2.0000 g | INTRAVENOUS | Status: AC
  Administered 2022-10-17: 2 g via INTRAVENOUS

## 2022-10-17 MED ORDER — DEXAMETHASONE SODIUM PHOSPHATE 10 MG/ML IJ SOLN
INTRAMUSCULAR | Status: DC | PRN
  Administered 2022-10-17: 10 mg via INTRAVENOUS

## 2022-10-17 MED ORDER — MIDAZOLAM HCL 2 MG/2ML IJ SOLN
INTRAMUSCULAR | Status: AC
  Filled 2022-10-17: qty 2

## 2022-10-17 MED ORDER — LACTATED RINGERS IV SOLN: INTRAVENOUS | Status: DC

## 2022-10-17 MED ORDER — LIDOCAINE HCL (PF) 2 % IJ SOLN
INTRAMUSCULAR | Status: AC
  Filled 2022-10-17: qty 5

## 2022-10-17 MED ORDER — ACETAMINOPHEN 500 MG PO TABS
1000.0000 mg | ORAL_TABLET | Freq: Once | ORAL | Status: AC
  Administered 2022-10-17: 1000 mg via ORAL
  Filled 2022-10-17: qty 2

## 2022-10-17 MED ORDER — PROPOFOL 10 MG/ML IV BOLUS
INTRAVENOUS | Status: AC
  Filled 2022-10-17: qty 20

## 2022-10-17 MED ORDER — KETOROLAC TROMETHAMINE 30 MG/ML IJ SOLN
30.0000 mg | Freq: Once | INTRAMUSCULAR | Status: AC
  Administered 2022-10-17: 30 mg via INTRAVENOUS
  Filled 2022-10-17: qty 1

## 2022-10-17 MED ORDER — ONDANSETRON HCL 4 MG/2ML IJ SOLN
INTRAMUSCULAR | Status: DC | PRN
  Administered 2022-10-17: 4 mg via INTRAVENOUS

## 2022-10-17 MED ORDER — DEXAMETHASONE SODIUM PHOSPHATE 10 MG/ML IJ SOLN
INTRAMUSCULAR | Status: AC
  Filled 2022-10-17: qty 1

## 2022-10-17 MED ORDER — IBUPROFEN 800 MG PO TABS: 800.0000 mg | ORAL_TABLET | Freq: Three times a day (TID) | ORAL | 1 refills | Status: AC | PRN

## 2022-10-17 MED ORDER — CEFAZOLIN SODIUM-DEXTROSE 2-4 GM/100ML-% IV SOLN
INTRAVENOUS | Status: AC
  Filled 2022-10-17: qty 100

## 2022-10-17 MED ORDER — LIDOCAINE 2% (20 MG/ML) 5 ML SYRINGE
INTRAMUSCULAR | Status: DC | PRN
  Administered 2022-10-17: 100 mg via INTRAVENOUS

## 2022-10-17 MED ORDER — BUPIVACAINE-EPINEPHRINE (PF) 0.5% -1:200000 IJ SOLN
INTRAMUSCULAR | Status: DC | PRN
  Administered 2022-10-17: 30 mL

## 2022-10-17 MED ORDER — PROPOFOL 10 MG/ML IV BOLUS
INTRAVENOUS | Status: DC | PRN
  Administered 2022-10-17 (×4): 50 mg via INTRAVENOUS
  Administered 2022-10-17: 200 mg via INTRAVENOUS
  Administered 2022-10-17: 50 mg via INTRAVENOUS

## 2022-10-17 MED ORDER — ORAL CARE MOUTH RINSE: 15.0000 mL | Freq: Once | OROMUCOSAL | Status: DC

## 2022-10-17 MED ORDER — FENTANYL CITRATE (PF) 100 MCG/2ML IJ SOLN
INTRAMUSCULAR | Status: DC | PRN
  Administered 2022-10-17: 25 ug via INTRAVENOUS
  Administered 2022-10-17 (×3): 50 ug via INTRAVENOUS
  Administered 2022-10-17: 25 ug via INTRAVENOUS

## 2022-10-17 MED ORDER — MIDAZOLAM HCL 5 MG/5ML IJ SOLN
INTRAMUSCULAR | Status: DC | PRN
  Administered 2022-10-17: 2 mg via INTRAVENOUS

## 2022-10-17 MED ORDER — LACTATED RINGERS IV SOLN: INTRAVENOUS | Status: DC | PRN

## 2022-10-17 MED ORDER — SODIUM CHLORIDE 0.9 % IR SOLN
Status: DC | PRN
  Administered 2022-10-17: 1000 mL

## 2022-10-17 MED ORDER — CHLORHEXIDINE GLUCONATE 0.12 % MT SOLN: 15.0000 mL | Freq: Once | OROMUCOSAL | Status: DC

## 2022-10-17 SURGICAL SUPPLY — 62 items
APL PRP STRL LF DISP 70% ISPRP (MISCELLANEOUS) ×1
APL SKNCLS STERI-STRIP NONHPOA (GAUZE/BANDAGES/DRESSINGS) ×1
BANDAGE ESMARK 4X12 BL STRL LF (DISPOSABLE) ×1 IMPLANT
BENZOIN TINCTURE PRP APPL 2/3 (GAUZE/BANDAGES/DRESSINGS) ×1 IMPLANT
BIT DRILL 2 FAST STEP (BIT) IMPLANT
BIT DRILL 2.5X4 QC (BIT) IMPLANT
BLADE SURG SZ10 CARB STEEL (BLADE) IMPLANT
BNDG CMPR 12X4 ELC STRL LF (DISPOSABLE) ×1
BNDG CMPR STD VLCR NS LF 5.8X3 (GAUZE/BANDAGES/DRESSINGS) ×1
BNDG COHESIVE 4X5 TAN STRL (GAUZE/BANDAGES/DRESSINGS) ×1 IMPLANT
BNDG ELASTIC 3X5.8 VLCR NS LF (GAUZE/BANDAGES/DRESSINGS) ×1 IMPLANT
BNDG ESMARK 4X12 BLUE STRL LF (DISPOSABLE) ×1
CHLORAPREP W/TINT 26 (MISCELLANEOUS) ×1 IMPLANT
CLOTH BEACON ORANGE TIMEOUT ST (SAFETY) ×1 IMPLANT
CLSR STERI-STRIP ANTIMIC 1/2X4 (GAUZE/BANDAGES/DRESSINGS) ×1 IMPLANT
COVER LIGHT HANDLE STERIS (MISCELLANEOUS) ×2 IMPLANT
CUFF TOURN SGL QUICK 18X4 (TOURNIQUET CUFF) ×1 IMPLANT
DECANTER SPIKE VIAL GLASS SM (MISCELLANEOUS) ×1 IMPLANT
DRAPE C-ARM FOLDED MOBILE STRL (DRAPES) ×1 IMPLANT
DRAPE HALF SHEET 40X57 (DRAPES) IMPLANT
ELECT REM PT RETURN 9FT ADLT (ELECTROSURGICAL) ×1
ELECTRODE REM PT RTRN 9FT ADLT (ELECTROSURGICAL) ×1 IMPLANT
GAUZE PAD ABD 8X10 STRL (GAUZE/BANDAGES/DRESSINGS) IMPLANT
GAUZE SPONGE 4X4 12PLY STRL (GAUZE/BANDAGES/DRESSINGS) ×1 IMPLANT
GAUZE XEROFORM 1X8 LF (GAUZE/BANDAGES/DRESSINGS) ×1 IMPLANT
GAUZE XEROFORM 5X9 LF (GAUZE/BANDAGES/DRESSINGS) ×1 IMPLANT
GLOVE BIOGEL PI IND STRL 6.5 (GLOVE) IMPLANT
GLOVE BIOGEL PI IND STRL 7.0 (GLOVE) ×2 IMPLANT
GLOVE SS N UNI LF 8.5 STRL (GLOVE) ×1 IMPLANT
GLOVE SURG POLYISO LF SZ8 (GLOVE) ×1 IMPLANT
GOWN STRL REUS W/TWL LRG LVL3 (GOWN DISPOSABLE) ×2 IMPLANT
GOWN STRL REUS W/TWL XL LVL3 (GOWN DISPOSABLE) ×1 IMPLANT
INST SET MINOR BONE (KITS) ×1 IMPLANT
K-WIRE 1.6 (WIRE) ×1
K-WIRE FX5X1.6XNS BN SS (WIRE) ×1
KIT TURNOVER KIT A (KITS) ×1 IMPLANT
KWIRE FX5X1.6XNS BN SS (WIRE) IMPLANT
MANIFOLD NEPTUNE II (INSTRUMENTS) ×1 IMPLANT
NDL HYPO 21X1.5 SAFETY (NEEDLE) ×1 IMPLANT
NEEDLE HYPO 21X1.5 SAFETY (NEEDLE) ×1 IMPLANT
NS IRRIG 1000ML POUR BTL (IV SOLUTION) ×1 IMPLANT
PACK BASIC LIMB (CUSTOM PROCEDURE TRAY) ×1 IMPLANT
PAD ABD 5X9 TENDERSORB (GAUZE/BANDAGES/DRESSINGS) IMPLANT
PAD ARMBOARD 7.5X6 YLW CONV (MISCELLANEOUS) ×1 IMPLANT
PADDING CAST COTTON 6X4 STRL (CAST SUPPLIES) IMPLANT
PEG THREADED 2.5MMX18MM LONG (Peg) IMPLANT
PEG THREADED 2.5MMX22MM LONG (Peg) IMPLANT
PEG THREADED 2.5MMX26MM LONG (Peg) IMPLANT
PLATE XLONG 24.4X89.5 RT (Plate) IMPLANT
SCREW CORT 3.5X14 LNG (Screw) IMPLANT
SCREW CORT 3.5X16 LNG (Screw) IMPLANT
SET BASIN LINEN APH (SET/KITS/TRAYS/PACK) ×1 IMPLANT
SPLINT FIBERGLASS 3X35 (CAST SUPPLIES) IMPLANT
SPONGE T-LAP 18X18 ~~LOC~~+RFID (SPONGE) ×2 IMPLANT
STRIP CLOSURE SKIN 1/2X4 (GAUZE/BANDAGES/DRESSINGS) IMPLANT
SUT ETHILON 3 0 FSL (SUTURE) IMPLANT
SUT MON AB 2-0 SH 27 (SUTURE) ×1
SUT MON AB 2-0 SH27 (SUTURE) ×1 IMPLANT
SYR 30ML LL (SYRINGE) IMPLANT
SYR CONTROL 10ML LL (SYRINGE) ×1 IMPLANT
TUBE CONNECTING 12X1/4 (SUCTIONS) IMPLANT
WATER STERILE IRR 1000ML POUR (IV SOLUTION) ×1 IMPLANT

## 2022-10-17 NOTE — Interval H&P Note (Signed)
History and Physical Interval Note:  10/17/2022 12:33 PM  James Reed  has presented today for surgery, with the diagnosis of right wrist fracture.  The various methods of treatment have been discussed with the patient and family. After consideration of risks, benefits and other options for treatment, the patient has consented to  Procedure(s): OPEN REDUCTION INTERNAL FIXATION (ORIF) WRIST FRACTURE/ removal of plate right wrist (Right) as a surgical intervention.  The patient's history has been reviewed, patient examined, no change in status, stable for surgery.  I have reviewed the patient's chart and labs.  Questions were answered to the patient's satisfaction.     Arther Abbott

## 2022-10-17 NOTE — ED Provider Notes (Signed)
[facility] Provider Note   CSN: [id number] Arrival date & time: 10/17/22  1819     History Chief Complaint  Patient presents with   Post-op Problem    James Reed is a 37 y.o. male.  Patient is emergency department with postoperative complaints.  He reports that he had ORIF of the right wrist performed following a Colles' fracture.  Surgery was performed earlier today.  He is reporting a still has some lingering numbness in the left hand but still has intact motor function.  He is also concerned that there is some bleeding noted on the Ace wrap from the incision sites.  HPI     Home Medications Prior to Admission medications   Medication Sig Start Date End Date Taking? Authorizing Provider  ibuprofen (ADVIL) 800 MG tablet Take 1 tablet (800 mg total) by mouth every 8 (eight) hours as needed. 10/17/22   Carole Civil, MD  oxyCODONE (OXY IR/ROXICODONE) 5 MG immediate release tablet 1-2 every 4 hours as needed pain 10/17/22   Carole Civil, MD      Allergies    Patient has no known allergies.    Review of Systems   Review of Systems  Neurological:  Positive for numbness.  All other systems reviewed and are negative.   Physical Exam Updated Vital Signs BP (!) 142/101 (BP Location: Left Arm)   Pulse (!) 109   Temp 98.7 F (37.1 C) (Oral)   Resp 18   Ht 5\' 7"  (1.702 m)   Wt 95.3 kg   SpO2 91%   BMI 32.89 kg/m  Physical Exam Vitals and nursing note reviewed.  Constitutional:      General: He is not in acute distress.    Appearance: He is well-developed.  HENT:     Head: Normocephalic and atraumatic.  Eyes:     Conjunctiva/sclera: Conjunctivae normal.  Cardiovascular:     Rate and Rhythm: Normal rate and regular rhythm.     Heart sounds: No murmur heard. Pulmonary:     Effort: Pulmonary effort is normal. No respiratory distress.     Breath sounds: Normal breath sounds.  Abdominal:     Palpations: Abdomen  is soft.     Tenderness: There is no abdominal tenderness.  Musculoskeletal:        General: Tenderness present. No swelling.     Cervical back: Neck supple.  Skin:    General: Skin is warm and dry.     Capillary Refill: Capillary refill takes less than 2 seconds.  Neurological:     Mental Status: He is alert.     Motor: No weakness.     Comments: Some numbness noted to the first through third fingers on the right hand.  Strength intact though in this area.   Psychiatric:        Mood and Affect: Mood normal.     ED Results / Procedures / Treatments   Labs (all labs ordered are listed, but only abnormal results are displayed) Labs Reviewed - No data to display  EKG None  Radiology DG Wrist Complete Right  Result Date: 10/17/2022 CLINICAL DATA:  [id number] Fracture [id number] EXAM: RIGHT WRIST - COMPLETE 3+ VIEW COMPARISON:  Radiograph 10/08/2022 FINDINGS: Intraoperative images during previous distal radius hardware removal and new volar plate fixation for distal radial diaphyseal fracture. New hardware is intact without evidence of immediate complication. Improved, near anatomic fracture alignment. IMPRESSION: Intraoperative images during previous hardware removal and  new plate fixation of the distal radius. Improved, near anatomic distal radial fracture alignment. No evidence of immediate complication. Electronically Signed   By: Maurine Simmering M.D.   On: 10/17/2022 15:17   DG C-Arm 1-60 Min-No Report  Result Date: 10/17/2022 Fluoroscopy was utilized by the requesting physician.  No radiographic interpretation.    Procedures Procedures   Medications Ordered in ED Medications - No data to display  ED Course/ Medical Decision Making/ A&P                           Medical Decision Making   Problem List / ED Course:  Patient presents to the emergency department complaints of postoperative concerns.  She had ORIF of the right wrist performed earlier today and is complaining about numbness  in the first two third digits of the right hand.  He reports that strength is intact bilaterally and pain is minimal in this area. There is some bloody drainage noted on patient's dressing but exam is otherwise unremarkable. After looking at operative note from Dr. Aline Brochure, it does appear that marcaine was used on the skin in the area that was sutured. Given that patient is only hours post-op, I believe that this anesthetic may still be causing some numbness in the area, particularly if any anesthetic infiltrated close to the radial nerve. Advised patient of this and that he would likely regain full sensation soon. Will also change patient's ACE dressing out since they have blood on them. Advised patient to follow up with Dr. Aline Brochure with orthopedics as planned at this point as I do not believe there is more significant concern at this time. Patient agreeable with treatment plan and verbalized understanding return precautions. All questions answered prior to patient discharge.   Final Clinical Impression(s) / ED Diagnoses Final diagnoses:  Post-operative numbness    Rx / DC Orders ED Discharge Orders     None         Luvenia Heller, PA-C 10/17/22 2243    Cristie Hem, MD 10/18/22 1242

## 2022-10-17 NOTE — Progress Notes (Signed)
Instructed on incentive spirometer. 2000 ml obtained. Tolerated well. °

## 2022-10-17 NOTE — Op Note (Signed)
Orthopaedic Surgery Operative Note (CSN: [id number])  James Reed  06/23/1986 Date of Surgery: 10/17/2022   Diagnoses:  right wrist fracture/Galeazzi fracture variant at the end of a prior plate  Procedure: Open internal fixation right distal radius fracture with removal of prior plate   Operative Finding Successful completion of the planned procedure.    The fracture was at the end of the previous implanted plate   Post-Op Diagnosis: Same Surgeons:Primary: Carole Civil, MD Assistants: Delene Ruffini Location: [facility] OR ROOM 4 Anesthesia: General Antibiotics: Ancef 2 g Tourniquet time:  Total Tourniquet Time Documented: area (Right) - 102 minutes Total: area (Right) - 102 minutes  Estimated Blood Loss: 10 cc Complications: None Specimens: Implants: Implant Name Type Inv. Item Serial No. Manufacturer Lot No. LRB No. Used Action  PLATE XLONG D34-534 RT - SSTERILE ON SET Plate PLATE XLONG D34-534 RT STERILE ON SET ZIMMER RECON(ORTH,TRAU,BIO,SG)  Right 1 Implanted  SCREW CORT 3.5X14 LNG - SSTERILE ON SET Screw SCREW CORT 3.5X14 LNG STERILE ON SET ZIMMER RECON(ORTH,TRAU,BIO,SG)  Right 3 Implanted  SCREW CORT 3.5X16 LNG - SSTERILE ON SET Screw SCREW CORT 3.5X16 LNG STERILE ON SET ZIMMER RECON(ORTH,TRAU,BIO,SG)  Right 3 Implanted  PEG THREADED 2.5MMX22MM LONG - SSTERILE ON SET Peg PEG THREADED 2.5MMX22MM LONG STERILE ON SET ZIMMER RECON(ORTH,TRAU,BIO,SG)  Right 3 Implanted  PEG THREADED 2.5MMX26MM LONG - SSTERILE ON SET Peg PEG THREADED 2.5MMX26MM LONG STERILE ON SET ZIMMER RECON(ORTH,TRAU,BIO,SG)  Right 1 Implanted  PEG THREADED 2.5MMX18MM LONG - SSTERILE ON SET Peg PEG THREADED [id number] LONG STERILE ON SET ZIMMER RECON(ORTH,TRAU,BIO,SG)  Right 1 Implanted    Indications for Surgery:   James Reed is a 37 y.o. male who had 2 prior fractures of his right radius both plated.  He fractured again this time at the end of the plate.  Presented for open reduction internal  fixation and plate removal.  Benefits and risks of operative and nonoperative management were discussed prior to surgery with patient/guardian(s) and informed consent form was completed.  Specific risks including infection, need for additional surgery, neurovascular injury.   Procedure:   The patient was identified properly. Informed consent was obtained and the surgical site was marked. The patient was taken to the OR where general anesthesia was induced.  The patient was positioned supine on the operating table tourniquet on the right arm.  The right arm and upper extremity was prepped and draped in the usual sterile fashion.  Timeout was performed before the beginning of the case.  Tourniquet was used for the above duration.  102 minutes  After all of that was done and the timeout was done the limb was exsanguinated with a 4 inch Esmarch.  The previous incision was used careful dissection was carried out through the skin blunt dissection was carried out until we reached the subcu layer and then blunt dissection with finger dissection was performed until we found the FCR we move this ulnarly continued dissection to find the radial artery move this radially.  We found the flexor to the thumb but the whole area was a fracture.  We continued subperiosteal dissection to find the plate and the fracture we irrigated the fracture really remove the plate we curetted the bones we irrigated we curetted the bone holes again from the prior screws  We then did an open reduction using bone clamps we got the fracture can very well.  Images were obtained.  I then struggled to keep the reduction while trying to place the  plate finally decided to place a K wire retrograde through the fracture site which held it.  I then put the plate on starting with the cortical screws and then the peg screws  The implants are threaded pegs distally and cortical screws proximally  I did take the pin out and then placed a screw  through the fracture site through the plate  Final imaging confirmed position of hardware and reduction of the fracture  The wounds were irrigated and closed with 2-0 running Monocryl suture followed by skin injection with Marcaine with epinephrine and then sugar-tong splint  Postop plan  He will have to be in a sugar-tong or long-arm cast for 4 weeks at that time we can take an x-ray and determine if he can go to a short arm cast  Wound check in a week

## 2022-10-17 NOTE — Brief Op Note (Signed)
10/17/2022  3:24 PM  PATIENT:  James Reed  37 y.o. male  PRE-OPERATIVE DIAGNOSIS:  right wrist fracture  POST-OPERATIVE DIAGNOSIS:  right wrist fracture  PROCEDURE:  Procedure(s): OPEN REDUCTION INTERNAL FIXATION (ORIF) WRIST FRACTURE/ removal of plate right wrist (Right)  SURGEON:  Surgeon(s) and Role:    Carole Civil, MD - Primary  Surgical findings fracture at the end of the plate    The implant was a Biomet volar plate   Multiple screws cortical and threaded pegs   PHYSICIAN ASSISTANT:   ASSISTANTS: Presley Raddle    ANESTHESIA:   general  EBL:  10 mL   BLOOD ADMINISTERED:none  DRAINS: none   LOCAL MEDICATIONS USED:  MARCAINE     SPECIMEN:  No Specimen  DISPOSITION OF SPECIMEN:  N/A  COUNTS:  YES  TOURNIQUET: 250 mmHg for 100 minutes  DICTATION: .Dragon Dictation  PLAN OF CARE: Discharge to home after PACU  PATIENT DISPOSITION:  PACU - hemodynamically stable.   Delay start of Pharmacological VTE agent (>24hrs) due to surgical blood loss or risk of bleeding: not applicable

## 2022-10-17 NOTE — ED Notes (Signed)
Patient verbalizes understanding of discharge instructions. Opportunity for questioning and answers were provided. Armband removed by staff, pt discharged from ED. Ambulated out to lobby  

## 2022-10-17 NOTE — Discharge Instructions (Signed)
You were seen in the emergency department for numbness in the right hand. I believe that this is expected given that an anesthetic was used in the skin close to the incision site that was sutured. Please plan on following up with Dr. Aline Brochure as planned at this point.

## 2022-10-17 NOTE — Anesthesia Preprocedure Evaluation (Signed)
Anesthesia Evaluation  Patient identified by MRN, date of birth, ID band Patient awake    Reviewed: Allergy & Precautions, NPO status , Patient's Chart, lab work & pertinent test results  Airway Mallampati: II  TM Distance: >3 FB Neck ROM: Full    Dental  (+) Teeth Intact, Dental Advisory Given   Pulmonary    breath sounds clear to auscultation       Cardiovascular  Rhythm:Regular Rate:Normal     Neuro/Psych    GI/Hepatic ,GERD  Medicated and Controlled,,  Endo/Other    Renal/GU      Musculoskeletal   Abdominal Normal abdominal exam  (+)   Peds  Hematology   Anesthesia Other Findings   Reproductive/Obstetrics                             Anesthesia Physical Anesthesia Plan  ASA: 2  Anesthesia Plan: General   Post-op Pain Management:    Induction: Intravenous  PONV Risk Score and Plan: Ondansetron and Dexamethasone  Airway Management Planned: LMA  Additional Equipment:   Intra-op Plan:   Post-operative Plan: Extubation in OR  Informed Consent: I have reviewed the patients History and Physical, chart, labs and discussed the procedure including the risks, benefits and alternatives for the proposed anesthesia with the patient or authorized representative who has indicated his/her understanding and acceptance.     Dental advisory given  Plan Discussed with:   Anesthesia Plan Comments:        Anesthesia Quick Evaluation

## 2022-10-17 NOTE — ED Notes (Signed)
Pt's R arm rewrapped with ACE bandage

## 2022-10-17 NOTE — Transfer of Care (Signed)
Immediate Anesthesia Transfer of Care Note  Patient: James Reed  Procedure(s) Performed: OPEN REDUCTION INTERNAL FIXATION (ORIF) WRIST FRACTURE/ removal of plate right wrist (Right: Wrist)  Patient Location: PACU  Anesthesia Type:General  Level of Consciousness: awake, alert , oriented, and patient cooperative  Airway & Oxygen Therapy: Patient Spontanous Breathing and Patient connected to nasal cannula oxygen  Post-op Assessment: Report given to RN, Post -op Vital signs reviewed and stable, and Patient moving all extremities  Post vital signs: Reviewed and stable  Last Vitals:  Vitals Value Taken Time  BP    Temp    Pulse 91 10/17/22 1534  Resp 31 10/17/22 1534  SpO2 89 % 10/17/22 1534  Vitals shown include unvalidated device data.  Last Pain:  Vitals:   10/17/22 1052  PainSc: 0-No pain         Complications: No notable events documented.

## 2022-10-17 NOTE — ED Triage Notes (Signed)
Pt had orthopedic surgery early today to repair a fx of his R forearm. Pt states he is bleeding from his surgical site and has numbness to his fingers. Pt has a bulky splint and dressing in place and no blood is noted to be saturating it at this time. Sensation is intact distally, but is reduced and pt has brisk cap refill noted in triage.

## 2022-10-17 NOTE — Anesthesia Procedure Notes (Signed)
Procedure Name: LMA Insertion Date/Time: 10/17/2022 1:20 PM  Performed by: Mammie Russian, RNPre-anesthesia Checklist: Patient identified, Emergency Drugs available, Suction available and Patient being monitored Patient Re-evaluated:Patient Re-evaluated prior to induction Oxygen Delivery Method: Circle System Utilized Preoxygenation: Pre-oxygenation with 100% oxygen Induction Type: IV induction Ventilation: Mask ventilation without difficulty LMA: LMA inserted LMA Size: 5.0 Tube type: Oral Number of attempts: 1 Placement Confirmation: positive ETCO2 and breath sounds checked- equal and bilateral Tube secured with: Tape Dental Injury: Teeth and Oropharynx as per pre-operative assessment

## 2022-10-19 NOTE — Anesthesia Postprocedure Evaluation (Signed)
Anesthesia Post Note  Patient: James Reed  Procedure(s) Performed: OPEN REDUCTION INTERNAL FIXATION (ORIF) WRIST FRACTURE/ removal of plate right wrist (Right: Wrist)  Patient location during evaluation: Phase II Anesthesia Type: General Level of consciousness: awake Pain management: pain level controlled Vital Signs Assessment: post-procedure vital signs reviewed and stable Respiratory status: spontaneous breathing and respiratory function stable Cardiovascular status: blood pressure returned to baseline and stable Postop Assessment: no headache and no apparent nausea or vomiting Anesthetic complications: no Comments: Late entry   No notable events documented.   Last Vitals:  Vitals:   10/17/22 1600 10/17/22 1613  BP:  (!) 140/88  Pulse: (!) 109 (!) 109  Resp: 17 17  Temp:  36.8 C  SpO2: 91% 93%    Last Pain:  Vitals:   10/18/22 0937  TempSrc:   PainSc: [place]

## 2022-10-24 ENCOUNTER — Encounter (HOSPITAL_COMMUNITY): Payer: Self-pay | Admitting: Orthopedic Surgery

## 2022-10-26 ENCOUNTER — Ambulatory Visit (INDEPENDENT_AMBULATORY_CARE_PROVIDER_SITE_OTHER): Payer: 59 | Admitting: Orthopedic Surgery

## 2022-10-26 ENCOUNTER — Other Ambulatory Visit: Payer: Self-pay

## 2022-10-26 ENCOUNTER — Encounter: Payer: Self-pay | Admitting: Orthopedic Surgery

## 2022-10-26 ENCOUNTER — Other Ambulatory Visit (INDEPENDENT_AMBULATORY_CARE_PROVIDER_SITE_OTHER): Payer: 59

## 2022-10-26 DIAGNOSIS — Z9889 Other specified postprocedural states: Secondary | ICD-10-CM

## 2022-10-26 DIAGNOSIS — S52371D Galeazzi's fracture of right radius, subsequent encounter for closed fracture with routine healing: Secondary | ICD-10-CM

## 2022-10-26 DIAGNOSIS — Z8781 Personal history of (healed) traumatic fracture: Secondary | ICD-10-CM

## 2022-10-26 NOTE — Progress Notes (Signed)
Postop appointment #1 at postop day 9  Encounter Diagnoses  Name Primary?   S/P ORIF (open reduction internal fixation) fracture right wristx3 most recent 10/17/22 Yes   Closed Galeazzi's fracture of right radius with routine healing, subsequent encounter    Diagnoses:  right wrist fracture/Galeazzi fracture variant at the end of a prior plate   Procedure: Open internal fixation right distal radius fracture with removal of prior plate   Operative Finding Successful completion of the planned procedure.     The fracture was at the end of the previous implanted plate  He went to the ER for numbness in the hand this resolved.  His wound looks good.  His x-ray looks perfect.  Long-arm cast for 3 weeks  Repeat x-ray out of plaster and then brace or cast for another 4 weeks

## 2022-10-26 NOTE — Patient Instructions (Signed)
RTW APRIL 15TH

## 2022-11-16 ENCOUNTER — Ambulatory Visit (INDEPENDENT_AMBULATORY_CARE_PROVIDER_SITE_OTHER): Payer: 59 | Admitting: Orthopedic Surgery

## 2022-11-16 ENCOUNTER — Other Ambulatory Visit (INDEPENDENT_AMBULATORY_CARE_PROVIDER_SITE_OTHER): Payer: 59

## 2022-11-16 DIAGNOSIS — Z8781 Personal history of (healed) traumatic fracture: Secondary | ICD-10-CM | POA: Diagnosis not present

## 2022-11-16 DIAGNOSIS — Z9889 Other specified postprocedural states: Secondary | ICD-10-CM | POA: Diagnosis not present

## 2022-11-16 NOTE — Progress Notes (Signed)
   This is a postop visit  Encounter Diagnosis  Name Primary?   S/P ORIF (open reduction internal fixation) fracture right wristx3 most recent 10/17/22 Yes    Chief Complaint  Patient presents with   Routine Post Op    R wrist DOS 10/17/22     The patient is status post hardware removal right radius with open reduction internal fixation of radius fracture  This is postop day number 30  Postop pain control no meds needed  Subjective complaints right elbow stiffness  Physical exam findings into act clean incision decreased rotation pronation supination and wrist flexion extension  Assessment and plan  Overall x-rays look good today he is placed in a brace no heavy lifting activity modification emphasized  X-ray in 4 weeks (he is in a removable Velcro splint type brace)

## 2022-12-14 ENCOUNTER — Encounter: Payer: 59 | Admitting: Orthopedic Surgery

## 2022-12-14 DIAGNOSIS — Z8781 Personal history of (healed) traumatic fracture: Secondary | ICD-10-CM
